# Patient Record
Sex: Female | Born: 1990
Health system: Southern US, Community
[De-identification: ages and names within clinical notes are randomized; demographics above are authoritative.]

## PROBLEM LIST (undated history)

## (undated) DIAGNOSIS — R87629 Unspecified abnormal cytological findings in specimens from vagina: Secondary | ICD-10-CM

## (undated) HISTORY — PX: NO PAST SURGERIES: SHX2092

## (undated) HISTORY — PX: GYNECOLOGIC CRYOSURGERY: SHX857

## (undated) HISTORY — DX: Unspecified abnormal cytological findings in specimens from vagina: R87.629

---

## 2009-03-01 ENCOUNTER — Ambulatory Visit: Payer: Self-pay | Admitting: Obstetrics and Gynecology

## 2009-03-01 ENCOUNTER — Encounter: Payer: Self-pay | Admitting: Family Medicine

## 2009-03-01 LAB — CONVERTED CEMR LAB
Antibody Screen: NEGATIVE
Basophils Absolute: 0 10*3/uL (ref 0.0–0.1)
Basophils Relative: 0 % (ref 0–1)
Eosinophils Absolute: 0.1 10*3/uL (ref 0.0–0.7)
Eosinophils Relative: 1 % (ref 0–5)
Hemoglobin: 12.4 g/dL (ref 12.0–15.0)
MCHC: 32.6 g/dL (ref 30.0–36.0)
MCV: 90.3 fL (ref 78.0–100.0)
Monocytes Absolute: 0.8 10*3/uL (ref 0.1–1.0)
Monocytes Relative: 9 % (ref 3–12)
RBC: 4.21 M/uL (ref 3.87–5.11)
RDW: 13.8 % (ref 11.5–15.5)
Rh Type: POSITIVE

## 2009-03-06 ENCOUNTER — Ambulatory Visit: Payer: Self-pay | Admitting: Obstetrics & Gynecology

## 2009-04-03 ENCOUNTER — Ambulatory Visit: Payer: Self-pay | Admitting: Obstetrics and Gynecology

## 2009-04-03 ENCOUNTER — Encounter: Payer: Self-pay | Admitting: Obstetrics & Gynecology

## 2009-04-03 LAB — CONVERTED CEMR LAB: Chlamydia, Swab/Urine, PCR: NEGATIVE

## 2009-04-24 ENCOUNTER — Ambulatory Visit: Payer: Self-pay | Admitting: Obstetrics and Gynecology

## 2009-04-24 ENCOUNTER — Encounter: Payer: Self-pay | Admitting: Obstetrics & Gynecology

## 2009-04-24 LAB — CONVERTED CEMR LAB
Hemoglobin: 11.4 g/dL — ABNORMAL LOW (ref 12.0–15.0)
MCHC: 33.5 g/dL (ref 30.0–36.0)
Platelets: 237 10*3/uL (ref 150–400)
RDW: 13.2 % (ref 11.5–15.5)

## 2009-05-10 ENCOUNTER — Ambulatory Visit: Payer: Self-pay | Admitting: Obstetrics & Gynecology

## 2009-05-24 ENCOUNTER — Ambulatory Visit: Payer: Self-pay | Admitting: Obstetrics & Gynecology

## 2009-06-12 ENCOUNTER — Ambulatory Visit: Payer: Self-pay | Admitting: Obstetrics & Gynecology

## 2009-06-26 ENCOUNTER — Ambulatory Visit: Payer: Self-pay | Admitting: Obstetrics & Gynecology

## 2009-06-27 ENCOUNTER — Encounter: Payer: Self-pay | Admitting: Obstetrics & Gynecology

## 2009-07-03 ENCOUNTER — Ambulatory Visit: Payer: Self-pay | Admitting: Obstetrics & Gynecology

## 2009-07-08 ENCOUNTER — Inpatient Hospital Stay (HOSPITAL_COMMUNITY): Admission: AD | Admit: 2009-07-08 | Discharge: 2009-07-08 | Payer: Self-pay | Admitting: Obstetrics and Gynecology

## 2009-07-10 ENCOUNTER — Ambulatory Visit: Payer: Self-pay | Admitting: Obstetrics and Gynecology

## 2009-07-17 ENCOUNTER — Ambulatory Visit: Payer: Self-pay | Admitting: Family Medicine

## 2009-07-19 ENCOUNTER — Ambulatory Visit: Payer: Self-pay | Admitting: Advanced Practice Midwife

## 2009-07-19 ENCOUNTER — Inpatient Hospital Stay (HOSPITAL_COMMUNITY): Admission: AD | Admit: 2009-07-19 | Discharge: 2009-07-21 | Payer: Self-pay | Admitting: Obstetrics & Gynecology

## 2009-09-04 ENCOUNTER — Ambulatory Visit: Payer: Self-pay | Admitting: Obstetrics & Gynecology

## 2010-05-13 LAB — GC/CHLAMYDIA PROBE AMP, GENITAL
Chlamydia, DNA Probe: NEGATIVE
GC Probe Amp, Genital: NEGATIVE

## 2010-05-13 LAB — WET PREP, GENITAL
Clue Cells Wet Prep HPF POC: NONE SEEN
Trich, Wet Prep: NONE SEEN
Yeast Wet Prep HPF POC: NONE SEEN

## 2010-05-13 LAB — URINALYSIS, ROUTINE W REFLEX MICROSCOPIC
Bilirubin Urine: NEGATIVE
Ketones, ur: NEGATIVE mg/dL
Nitrite: NEGATIVE
Specific Gravity, Urine: <1.005 — ABNORMAL LOW (ref 1.005–1.030)
Urobilinogen, UA: 0.2 mg/dL (ref 0.0–1.0)

## 2010-05-13 LAB — URINE MICROSCOPIC-ADD ON: RBC / HPF: NONE SEEN RBC/hpf (ref <3)

## 2010-05-13 LAB — CBC
Hemoglobin: 12.3 g/dL (ref 12.0–15.0)
MCHC: 34.3 g/dL (ref 30.0–36.0)
MCV: 85.1 fL (ref 78.0–100.0)
RBC: 4.22 MIL/uL (ref 3.87–5.11)
RDW: 13 % (ref 11.5–15.5)

## 2010-05-13 LAB — RPR: RPR Ser Ql: NONREACTIVE

## 2010-07-09 NOTE — Assessment & Plan Note (Signed)
NAMEBlima Ayala                ACCOUNT NO.:  [id number]   MEDICAL RECORD NO.:  [id number]          PATIENT TYPE:  POB   LOCATION:  [facility] at [facility]         FACILITY:  [facility]   PHYSICIAN:  Scheryl Darter, MD       DATE OF BIRTH:  April 29, 1990   DATE OF SERVICE:                                  CLINIC NOTE   The patient is postpartum from a vaginal delivery on Jul 19, 2009.  She  was at 40 weeks 1 day gestation and she delivered a 7 pound 7 ounce  female without significant lacerations.  She has done well postpartum.  She is nursing the baby and she wished to continue for a year.  She  would like to be on oral contraceptive.  She has not had sex since  delivery.  Her score for postpartum depression is low.  Her affect is  normal.   PHYSICAL EXAMINATION:  VITAL SIGNS:  Blood pressure is 116/72 and weight  is 116 pounds.  ABDOMEN:  Soft, nontender, no mass.  EXTERNAL GENITALIA:  Vagina and cervix appeared normal.  Uterus normal  size.  No adnexal masses or tenderness.   The patient is doing well.  She can return for yearly exams.  I gave her  a prescription for Micronor one p.o. daily.      Scheryl Darter, MD     JA/MEDQ  D:  09/04/2009  T:  09/05/2009  Job:  [id number]

## 2011-11-19 ENCOUNTER — Ambulatory Visit (INDEPENDENT_AMBULATORY_CARE_PROVIDER_SITE_OTHER): Payer: BC Managed Care – PPO | Admitting: Family Medicine

## 2011-11-19 DIAGNOSIS — N390 Urinary tract infection, site not specified: Secondary | ICD-10-CM

## 2011-11-19 LAB — POCT URINALYSIS DIPSTICK
Bilirubin, UA: NEGATIVE
Ketones, UA: NEGATIVE
Protein, UA: NEGATIVE
pH, UA: 6

## 2011-11-19 MED ORDER — NITROFURANTOIN MONOHYD MACRO 100 MG PO CAPS: 100.0000 mg | ORAL_CAPSULE | Freq: Two times a day (BID) | ORAL | Status: DC

## 2011-11-19 NOTE — Progress Notes (Signed)
Patient is having increased frequency with urination and would like to be checked, her urine is 1+ leukocytes and trace blood.

## 2012-01-06 ENCOUNTER — Encounter: Payer: Self-pay | Admitting: Obstetrics & Gynecology

## 2012-01-06 ENCOUNTER — Ambulatory Visit (INDEPENDENT_AMBULATORY_CARE_PROVIDER_SITE_OTHER): Payer: BC Managed Care – PPO | Admitting: Obstetrics & Gynecology

## 2012-01-06 VITALS — BP 106/68 | HR 67 | Ht 64.0 in | Wt 113.0 lb

## 2012-01-06 DIAGNOSIS — Z113 Encounter for screening for infections with a predominantly sexual mode of transmission: Secondary | ICD-10-CM

## 2012-01-06 DIAGNOSIS — Z1151 Encounter for screening for human papillomavirus (HPV): Secondary | ICD-10-CM

## 2012-01-06 DIAGNOSIS — Z01419 Encounter for gynecological examination (general) (routine) without abnormal findings: Secondary | ICD-10-CM

## 2012-01-06 DIAGNOSIS — Z0000 Encounter for general adult medical examination without abnormal findings: Secondary | ICD-10-CM

## 2012-01-06 DIAGNOSIS — Z124 Encounter for screening for malignant neoplasm of cervix: Secondary | ICD-10-CM

## 2012-01-06 NOTE — Progress Notes (Signed)
Subjective:    Kathleen Ayala is a 21 y.o. female who presents for an annual exam. The patient has no complaints today. The patient is sexually active. GYN screening history: last pap: was normal. The patient wears seatbelts: yes. The patient participates in regular exercise: yes. Has the patient ever been transfused or tattooed?: no. The patient reports that there is not domestic violence in her life.   Menstrual History: OB History    Grav Para Term Preterm Abortions TAB SAB Ect Mult Living   1 1 1       1       Menarche age: 59 Patient's last menstrual period was 12/22/2011.    The following portions of the patient's history were reviewed and updated as appropriate: allergies, current medications, past family history, past medical history, past social history, past surgical history and problem list.  Review of Systems A comprehensive review of systems was negative. She has been monogamous for 2 years and denies dyspareunia. Her 28 yo boyfriend is having issues with ED.   Objective:    BP 106/68  Pulse 67  Ht 5\' 4"  (1.626 m)  Wt 113 lb (51.256 kg)  BMI 19.40 kg/m2  LMP 12/22/2011  General Appearance:    Alert, cooperative, no distress, appears stated age  Head:    Normocephalic, without obvious abnormality, atraumatic  Eyes:    PERRL, conjunctiva/corneas clear, EOM's intact, fundi    benign, both eyes  Ears:    Normal TM's and external ear canals, both ears  Nose:   Nares normal, septum midline, mucosa normal, no drainage    or sinus tenderness  Throat:   Lips, mucosa, and tongue normal; teeth and gums normal  Neck:   Supple, symmetrical, trachea midline, no adenopathy;    thyroid:  no enlargement/tenderness/nodules; no carotid   bruit or JVD  Back:     Symmetric, no curvature, ROM normal, no CVA tenderness  Lungs:     Clear to auscultation bilaterally, respirations unlabored  Chest Wall:    No tenderness or deformity   Heart:    Regular rate and rhythm, S1 and S2 normal, no  murmur, rub   or gallop  Breast Exam:    No tenderness, masses, or nipple abnormality  Abdomen:     Soft, non-tender, bowel sounds active all four quadrants,    no masses, no organomegaly  Genitalia:    Normal female without lesion, discharge or tenderness, NSSRV, NT, no adnexal masses or tenderness     Extremities:   Extremities normal, atraumatic, no cyanosis or edema  Pulses:   2+ and symmetric all extremities  Skin:   Skin color, texture, turgor normal, no rashes or lesions  Lymph nodes:   Cervical, supraclavicular, and axillary nodes normal  Neurologic:   CNII-XII intact, normal strength, sensation and reflexes    throughout  .    Assessment:    Healthy female exam.    Plan:     Thin prep Pap smear.  She declines a flu vaccine today.

## 2012-05-12 ENCOUNTER — Ambulatory Visit: Payer: BC Managed Care – PPO | Admitting: Obstetrics and Gynecology

## 2012-05-12 DIAGNOSIS — Z3009 Encounter for other general counseling and advice on contraception: Secondary | ICD-10-CM

## 2012-09-28 ENCOUNTER — Encounter: Payer: Self-pay | Admitting: Family Medicine

## 2012-09-28 ENCOUNTER — Ambulatory Visit (INDEPENDENT_AMBULATORY_CARE_PROVIDER_SITE_OTHER): Payer: BC Managed Care – PPO | Admitting: Family Medicine

## 2012-09-28 VITALS — BP 120/73 | HR 85 | Ht 64.5 in | Wt 117.6 lb

## 2012-09-28 DIAGNOSIS — Z01812 Encounter for preprocedural laboratory examination: Secondary | ICD-10-CM

## 2012-09-28 DIAGNOSIS — Z113 Encounter for screening for infections with a predominantly sexual mode of transmission: Secondary | ICD-10-CM | POA: Insufficient documentation

## 2012-09-28 DIAGNOSIS — Z30430 Encounter for insertion of intrauterine contraceptive device: Secondary | ICD-10-CM

## 2012-09-28 MED ORDER — LEVONORGESTREL 20 MCG/24HR IU IUD: 1.0000 | INTRAUTERINE_SYSTEM | Freq: Once | INTRAUTERINE | Status: DC

## 2012-09-28 NOTE — Patient Instructions (Addendum)
Levonorgestrel intrauterine device (IUD) What is this medicine? LEVONORGESTREL IUD (LEE voe nor jes trel) is a contraceptive (birth control) device. The device is placed inside the uterus by a healthcare professional. It is used to prevent pregnancy and can also be used to treat heavy bleeding that occurs during your period. Depending on the device, it can be used for 3 to 5 years. This medicine may be used for other purposes; ask your health care provider or pharmacist if you have questions. What should I tell my health care provider before I take this medicine? They need to know if you have any of these conditions: -abnormal Pap smear -cancer of the breast, uterus, or cervix -diabetes -endometritis -genital or pelvic infection now or in the past -have more than one sexual partner or your partner has more than one partner -heart disease -history of an ectopic or tubal pregnancy -immune system problems -IUD in place -liver disease or tumor -problems with blood clots or take blood-thinners -use intravenous drugs -uterus of unusual shape -vaginal bleeding that has not been explained -an unusual or allergic reaction to levonorgestrel, other hormones, silicone, or polyethylene, medicines, foods, dyes, or preservatives -pregnant or trying to get pregnant -breast-feeding How should I use this medicine? This device is placed inside the uterus by a health care professional. Talk to your pediatrician regarding the use of this medicine in children. Special care may be needed. Overdosage: If you think you have taken too much of this medicine contact a poison control center or emergency room at once. NOTE: This medicine is only for you. Do not share this medicine with others. What if I miss a dose? This does not apply. What may interact with this medicine? Do not take this medicine with any of the following medications: -amprenavir -bosentan -fosamprenavir This medicine may also interact with  the following medications: -aprepitant -barbiturate medicines for inducing sleep or treating seizures -bexarotene -griseofulvin -medicines to treat seizures like carbamazepine, ethotoin, felbamate, oxcarbazepine, phenytoin, topiramate -modafinil -pioglitazone -rifabutin -rifampin -rifapentine -some medicines to treat HIV infection like atazanavir, indinavir, lopinavir, nelfinavir, tipranavir, ritonavir -St. John's wort -warfarin This list may not describe all possible interactions. Give your health care provider a list of all the medicines, herbs, non-prescription drugs, or dietary supplements you use. Also tell them if you smoke, drink alcohol, or use illegal drugs. Some items may interact with your medicine. What should I watch for while using this medicine? Visit your doctor or health care professional for regular check ups. See your doctor if you or your partner has sexual contact with others, becomes HIV positive, or gets a sexual transmitted disease. This product does not protect you against HIV infection (AIDS) or other sexually transmitted diseases. You can check the placement of the IUD yourself by reaching up to the top of your vagina with clean fingers to feel the threads. Do not pull on the threads. It is a good habit to check placement after each menstrual period. Call your doctor right away if you feel more of the IUD than just the threads or if you cannot feel the threads at all. The IUD may come out by itself. You may become pregnant if the device comes out. If you notice that the IUD has come out use a backup birth control method like condoms and call your health care provider. Using tampons will not change the position of the IUD and are okay to use during your period. What side effects may I notice from receiving this medicine?   Side effects that you should report to your doctor or health care professional as soon as possible: -allergic reactions like skin rash, itching or  hives, swelling of the face, lips, or tongue -fever, flu-like symptoms -genital sores -high blood pressure -no menstrual period for 6 weeks during use -pain, swelling, warmth in the leg -pelvic pain or tenderness -severe or sudden headache -signs of pregnancy -stomach cramping -sudden shortness of breath -trouble with balance, talking, or walking -unusual vaginal bleeding, discharge -yellowing of the eyes or skin Side effects that usually do not require medical attention (report to your doctor or health care professional if they continue or are bothersome): -acne -breast pain -change in sex drive or performance -changes in weight -cramping, dizziness, or faintness while the device is being inserted -headache -irregular menstrual bleeding within first 3 to 6 months of use -nausea This list may not describe all possible side effects. Call your doctor for medical advice about side effects. You may report side effects to FDA at 1-800-FDA-1088. Where should I keep my medicine? This does not apply. NOTE: This sheet is a summary. It may not cover all possible information. If you have questions about this medicine, talk to your doctor, pharmacist, or health care provider.  2013, Elsevier/Gold Standard. (03/13/2011 1:54:04 PM)  

## 2012-09-28 NOTE — Progress Notes (Signed)
  Subjective:    Patient ID: Kathleen Ayala, female    DOB: 03/23/1990, 23 y.o.   MRN: [id number]  HPI Here for Nexplanon insertion.  We have discussed Nexplanon side effects at length.  After careful consideration, pt. Elects for IUD.  New sexual partner since last GC/Chlam testing.   Review of Systems  Constitutional: Negative for fever and fatigue.  Cardiovascular: Negative for chest pain and leg swelling.  Gastrointestinal: Negative for abdominal pain.  Genitourinary: Negative for dysuria.       Objective:   Physical Exam  Vitals reviewed. Constitutional: She appears well-developed and well-nourished.  HENT:  Head: Normocephalic and atraumatic.  Eyes: No scleral icterus.  Cardiovascular: Normal rate.   Pulmonary/Chest: Effort normal.  Abdominal: Soft. There is no tenderness.  Genitourinary: Vagina normal and uterus normal.   Procedure: Patient identified, informed consent performed, signed copy in chart, time out was performed.  Urine pregnancy test negative.  Speculum placed in the vagina.  Cervix visualized.  Cleaned with Betadine x 2.  Grasped anteriourly with a single tooth tenaculum.  Uterus sounded to 8 cm.  Mirena IUD placed per manufacturer's recommendations.  Strings trimmed to 3 cm.   Patient given post procedure instructions and Mirena care card with expiration date.  Patient is asked to check IUD strings periodically and follow up in 4-6 weeks for IUD check.        Assessment & Plan:

## 2012-09-29 LAB — GC/CHLAMYDIA PROBE AMP: GC Probe RNA: NEGATIVE

## 2013-02-21 ENCOUNTER — Other Ambulatory Visit (INDEPENDENT_AMBULATORY_CARE_PROVIDER_SITE_OTHER): Payer: BC Managed Care – PPO | Admitting: *Deleted

## 2013-02-21 DIAGNOSIS — R300 Dysuria: Secondary | ICD-10-CM

## 2013-02-21 LAB — POCT URINALYSIS DIPSTICK
Ketones, UA: NEGATIVE
Protein, UA: NEGATIVE
Spec Grav, UA: >=1.03
Urobilinogen, UA: NEGATIVE
pH, UA: 6

## 2013-02-21 NOTE — Progress Notes (Signed)
Pt came in today to drop off a urine specimen.  Pt is having burning with urination and back pain.  I will send off a urine culture.  Pt is on her cycle currently.

## 2013-02-23 LAB — GC/CHLAMYDIA PROBE AMP, URINE: Chlamydia, Swab/Urine, PCR: NEGATIVE

## 2013-02-24 LAB — URINE CULTURE: Colony Count: 40000

## 2013-05-10 ENCOUNTER — Encounter: Payer: Self-pay | Admitting: Obstetrics & Gynecology

## 2013-05-10 ENCOUNTER — Ambulatory Visit (INDEPENDENT_AMBULATORY_CARE_PROVIDER_SITE_OTHER): Payer: BC Managed Care – PPO | Admitting: Obstetrics & Gynecology

## 2013-05-10 VITALS — BP 119/77 | HR 78 | Ht 64.0 in | Wt 115.0 lb

## 2013-05-10 DIAGNOSIS — Z30431 Encounter for routine checking of intrauterine contraceptive device: Secondary | ICD-10-CM

## 2013-05-10 DIAGNOSIS — N898 Other specified noninflammatory disorders of vagina: Secondary | ICD-10-CM

## 2013-05-10 NOTE — Progress Notes (Signed)
Subjective:     Patient ID: Kathleen Ayala, female   DOB: 04/08/1990, 23 y.o.   MRN: [id number]  HPI Pt c/o thick white cottage cheese discharge last Friday.  Used monistat OTC.  Sx imporved but, pt felt her IUD strings and is worried. No pain or abnormal sx   Review of Systems     Objective:   Physical Exam BP 119/77  Pulse 78  Ht 5\' 4"  (1.626 m)  Wt 115 lb (52.164 kg)  BMI 19.73 kg/m2  Pt in NAD GU: EGBUS: no lesions Vagina: slightly blood tinged fluid; no white clumps Cervix: no lesion; no mucopurulent d/c; no CMT. Strings noted and trimmed         Assessment:     Yeast vaginitis  IUD check    Plan:     KOH and wet smear done  IUD strings trimmed

## 2013-05-10 NOTE — Patient Instructions (Signed)
Monilial Vaginitis  Vaginitis in a soreness, swelling and redness (inflammation) of the vagina and vulva. Monilial vaginitis is not a sexually transmitted infection.  CAUSES   Yeast vaginitis is caused by yeast (candida) that is normally found in your vagina. With a yeast infection, the candida has overgrown in number to a point that upsets the chemical balance.  SYMPTOMS   · White, thick vaginal discharge.  · Swelling, itching, redness and irritation of the vagina and possibly the lips of the vagina (vulva).  · Burning or painful urination.  · Painful intercourse.  DIAGNOSIS   Things that may contribute to monilial vaginitis are:  · Postmenopausal and virginal states.  · Pregnancy.  · Infections.  · Being tired, sick or stressed, especially if you had monilial vaginitis in the past.  · Diabetes. Good control will help lower the chance.  · Birth control pills.  · Tight fitting garments.  · Using bubble bath, feminine sprays, douches or deodorant tampons.  · Taking certain medications that kill germs (antibiotics).  · Sporadic recurrence can occur if you become ill.  TREATMENT   Your caregiver will give you medication.  · There are several kinds of anti monilial vaginal creams and suppositories specific for monilial vaginitis. For recurrent yeast infections, use a suppository or cream in the vagina 2 times a week, or as directed.  · Anti-monilial or steroid cream for the itching or irritation of the vulva may also be used. Get your caregiver's permission.  · Painting the vagina with methylene blue solution may help if the monilial cream does not work.  · Eating yogurt may help prevent monilial vaginitis.  HOME CARE INSTRUCTIONS   · Finish all medication as prescribed.  · Do not have sex until treatment is completed or after your caregiver tells you it is okay.  · Take warm sitz baths.  · Do not douche.  · Do not use tampons, especially scented ones.  · Wear cotton underwear.  · Avoid tight pants and panty  hose.  · Tell your sexual partner that you have a yeast infection. They should go to their caregiver if they have symptoms such as mild rash or itching.  · Your sexual partner should be treated as well if your infection is difficult to eliminate.  · Practice safer sex. Use condoms.  · Some vaginal medications cause latex condoms to fail. Vaginal medications that harm condoms are:  · Cleocin cream.  · Butoconazole (Femstat®).  · Terconazole (Terazol®) vaginal suppository.  · Miconazole (Monistat®) (may be purchased over the counter).  SEEK MEDICAL CARE IF:   · You have a temperature by mouth above 102° F (38.9° C).  · The infection is getting worse after 2 days of treatment.  · The infection is not getting better after 3 days of treatment.  · You develop blisters in or around your vagina.  · You develop vaginal bleeding, and it is not your menstrual period.  · You have pain when you urinate.  · You develop intestinal problems.  · You have pain with sexual intercourse.  Document Released: 11/20/2004 Document Revised: 05/05/2011 Document Reviewed: 08/04/2008  ExitCare® Patient Information ©2014 [employer].

## 2013-05-11 LAB — WET PREP, GENITAL
TRICH WET PREP: NONE SEEN
WBC, Wet Prep HPF POC: NONE SEEN
YEAST WET PREP: NONE SEEN

## 2013-12-26 ENCOUNTER — Encounter: Payer: Self-pay | Admitting: Obstetrics & Gynecology

## 2015-05-01 ENCOUNTER — Encounter: Payer: Self-pay | Admitting: Obstetrics & Gynecology

## 2015-05-01 ENCOUNTER — Ambulatory Visit (INDEPENDENT_AMBULATORY_CARE_PROVIDER_SITE_OTHER): Payer: Managed Care, Other (non HMO) | Admitting: Obstetrics & Gynecology

## 2015-05-01 VITALS — BP 111/74 | HR 71 | Ht 64.0 in | Wt 114.0 lb

## 2015-05-01 DIAGNOSIS — Z01419 Encounter for gynecological examination (general) (routine) without abnormal findings: Secondary | ICD-10-CM | POA: Diagnosis not present

## 2015-05-01 DIAGNOSIS — Z1151 Encounter for screening for human papillomavirus (HPV): Secondary | ICD-10-CM | POA: Diagnosis not present

## 2015-05-01 DIAGNOSIS — L299 Pruritus, unspecified: Secondary | ICD-10-CM | POA: Diagnosis not present

## 2015-05-01 DIAGNOSIS — Z124 Encounter for screening for malignant neoplasm of cervix: Secondary | ICD-10-CM

## 2015-05-01 DIAGNOSIS — Z30431 Encounter for routine checking of intrauterine contraceptive device: Secondary | ICD-10-CM | POA: Diagnosis not present

## 2015-05-01 DIAGNOSIS — Z113 Encounter for screening for infections with a predominantly sexual mode of transmission: Secondary | ICD-10-CM | POA: Diagnosis not present

## 2015-05-01 NOTE — Progress Notes (Signed)
Patient ID: Kathleen PorteousJessica M Mann, female   DOB: 09/18/1990, 25 y.o.   MRN: [id number] Subjective:     Kathleen PorteousJessica M Mann is a 25 y.o. female here for a routine exam.  Current complaints: thick white discharge.  Slight itching.  Pt desires STI screening.  Monogamous for 3 years now new partner.   Gynecologic History No LMP recorded. Patient is not currently having periods (Reason: IUD). Contraception: IUD Last Pap: 01/06/2012. Results were: normal Last mammogram: n/a.   Obstetric History OB History  Gravida Para Term Preterm AB SAB TAB Ectopic Multiple Living  1 1 1       1     # Outcome Date GA Lbr Len/2nd Weight Sex Delivery Anes PTL Lv  1 Term 07/19/09    F    Y     History reviewed. No pertinent past medical history. History reviewed. No pertinent past surgical history. Current Outpatient Prescriptions on File Prior to Visit  Medication Sig Dispense Refill  . levonorgestrel (MIRENA) 20 MCG/24HR IUD 1 Intra Uterine Device (1 each total) by Intrauterine route once. Will place in clinic 1 each 0   No current facility-administered medications on file prior to visit.  No Known Allergies    The following portions of the patient's history were reviewed and updated as appropriate: allergies, current medications, past family history, past medical history, past social history, past surgical history and problem list.  Review of Systems Pertinent items are noted in HPI.    Objective:  BP 111/74 mmHg  Pulse 71  Ht 5\' 4"  (1.626 m)  Wt 114 lb (51.71 kg)  BMI 19.56 kg/m2  General Appearance:    Alert, cooperative, no distress, appears stated age  Head:    Normocephalic, without obvious abnormality, atraumatic  Eyes:    conjunctiva/corneas clear, EOM's intact, both eyes  Ears:    Normal external ear canals, both ears  Nose:   Nares normal, septum midline, mucosa normal, no drainage    or sinus tenderness  Throat:   Lips, mucosa, and tongue normal; teeth and gums normal  Neck:   Supple,  symmetrical, trachea midline, no adenopathy;    thyroid:  no enlargement/tenderness/nodules  Back:     Symmetric, no curvature, ROM normal, no CVA tenderness  Lungs:     Clear to auscultation bilaterally, respirations unlabored  Chest Wall:    No tenderness or deformity   Heart:    Regular rate and rhythm, S1 and S2 normal, no murmur, rub   or gallop  Breast Exam:    No tenderness, masses, or nipple abnormality  Abdomen:     Soft, non-tender, bowel sounds active all four quadrants,    no masses, no organomegaly  Genitalia:    Normal female without lesion, discharge or tenderness; uterus: small; mobile      Extremities:   Extremities normal, atraumatic, no cyanosis or edema  Pulses:   2+ and symmetric all extremities  Skin:   Skin color, texture, turgor normal, no rashes or lesions      Assessment:    Healthy female exam.   STI screen Vaginal itching- normal exam    Plan:    Follow up in: 1 year.    F/u PAP and cx; KOH and wet smear Labs: HIV, RPR, Hep C  Tyneshia Stivers L. Harraway-Smith, M.D., Evern CoreFACOG

## 2015-05-02 LAB — HEPATITIS C ANTIBODY: HCV AB: NEGATIVE

## 2015-05-02 LAB — HIV ANTIBODY (ROUTINE TESTING W REFLEX): HIV: NONREACTIVE

## 2015-05-02 LAB — RPR

## 2015-05-03 LAB — CYTOLOGY - PAP

## 2015-05-04 LAB — CERVICOVAGINAL ANCILLARY ONLY
CANDIDA VAGINITIS: NEGATIVE
HERPES (WINDOWPATH): NEGATIVE

## 2015-05-05 ENCOUNTER — Other Ambulatory Visit: Payer: Self-pay | Admitting: Obstetrics & Gynecology

## 2015-05-05 DIAGNOSIS — N760 Acute vaginitis: Principal | ICD-10-CM

## 2015-05-05 DIAGNOSIS — B9689 Other specified bacterial agents as the cause of diseases classified elsewhere: Secondary | ICD-10-CM

## 2015-05-05 MED ORDER — METRONIDAZOLE 500 MG PO TABS: 500.0000 mg | ORAL_TABLET | Freq: Two times a day (BID) | ORAL | Status: DC

## 2015-05-07 ENCOUNTER — Telehealth: Payer: Self-pay | Admitting: *Deleted

## 2015-05-07 NOTE — Telephone Encounter (Signed)
Pt called requesting results, informed her of + BV on wet prep and that medication had been sent to pharmacy.  Also informed her of abnormal pap and need for colpo, answered pt questions.  Scheduled appt 05-24-15 for colpo.

## 2015-05-07 NOTE — Progress Notes (Signed)
Left message for patient to call us back regarding lab work.

## 2015-05-08 NOTE — Progress Notes (Signed)
Called patient and informed her of results & medication sent to pharmacy. Patient verbalized understanding and asked for a sooner appt. Provided patient the appt desk number and instructed her to call them for a sooner appt. Patient verbalized understanding & had no questions

## 2015-05-24 ENCOUNTER — Encounter: Payer: Self-pay | Admitting: Family Medicine

## 2015-05-24 ENCOUNTER — Ambulatory Visit (INDEPENDENT_AMBULATORY_CARE_PROVIDER_SITE_OTHER): Payer: Managed Care, Other (non HMO) | Admitting: Family Medicine

## 2015-05-24 VITALS — BP 123/85 | HR 68 | Resp 16 | Ht 64.0 in | Wt 112.0 lb

## 2015-05-24 DIAGNOSIS — R87810 Cervical high risk human papillomavirus (HPV) DNA test positive: Secondary | ICD-10-CM | POA: Diagnosis not present

## 2015-05-24 DIAGNOSIS — R87610 Atypical squamous cells of undetermined significance on cytologic smear of cervix (ASC-US): Secondary | ICD-10-CM

## 2015-05-24 DIAGNOSIS — Z01812 Encounter for preprocedural laboratory examination: Secondary | ICD-10-CM

## 2015-05-24 DIAGNOSIS — N871 Moderate cervical dysplasia: Secondary | ICD-10-CM | POA: Insufficient documentation

## 2015-05-24 LAB — POCT URINE PREGNANCY: PREG TEST UR: NEGATIVE

## 2015-05-24 NOTE — Patient Instructions (Signed)
Colposcopy  Colposcopy is a procedure to examine your cervix and vagina, or the area around the outside of your vagina, for abnormalities or signs of disease. The procedure is done using a lighted microscope called a colposcope. Tissue samples may be collected during the colposcopy if your health care provider finds any unusual cells. A colposcopy may be done if a woman has:  · An abnormal Pap test. A Pap test is a medical test done to evaluate cells that are on the surface of the cervix.  · A Pap test result that is suggestive of human papillomavirus (HPV). This virus can cause genital warts and is linked to the development of cervical cancer.  · A sore on her cervix and the results of a Pap test were normal.  · Genital warts on the cervix or in or around the outside of the vagina.  · A mother who took the drug diethylstilbestrol (DES) while pregnant.  · Painful intercourse.  · Vaginal bleeding, especially after sexual intercourse.  LET YOUR HEALTH CARE PROVIDER KNOW ABOUT:  · Any allergies you have.  · All medicines you are taking, including vitamins, herbs, eye drops, creams, and over-the-counter medicines.  · Previous problems you or members of your family have had with the use of anesthetics.  · Any blood disorders you have.  · Previous surgeries you have had.  · Medical conditions you have.  RISKS AND COMPLICATIONS  Generally, a colposcopy is a safe procedure. However, as with any procedure, complications can occur. Possible complications include:  · Bleeding.  · Infection.  · Missed lesions.  BEFORE THE PROCEDURE   · Tell your health care provider if you have your menstrual period. A colposcopy typically is not done during menstruation.  · For 24 hours before the colposcopy, do not:    Douche.    Use tampons.    Use medicines, creams, or suppositories in the vagina.    Have sexual intercourse.  PROCEDURE   During the procedure, you will be lying on your back with your feet in foot rests (stirrups). A warm  metal or plastic instrument (speculum) will be placed in your vagina to keep it open and to allow the health care provider to see the cervix. The colposcope will be placed outside the vagina. It will be used to magnify and examine the cervix, vagina, and the area around the outside of the vagina. A small amount of liquid solution will be placed on the area that is to be viewed. This solution will make it easier to see the abnormal cells. Your health care provider will use tools to suck out mucus and cells from the canal of the cervix. Then he or she will record the location of the abnormal areas.  If a biopsy is done during the procedure, a medicine will usually be given to numb the area (local anesthetic). You may feel mild pain or cramping while the biopsy is done. After the procedure, tissue samples collected during the biopsy will be sent to a lab for analysis.  AFTER THE PROCEDURE   You will be given instructions on when to follow up with your health care provider for your test results. It is important to keep your appointment.     This information is not intended to replace advice given to you by your health care provider. Make sure you discuss any questions you have with your health care provider.     Document Released: 05/03/2002 Document Revised: 10/13/2012 Document Reviewed: 09/09/2012    Elsevier Interactive Patient Education ©2016 Elsevier Inc.

## 2015-05-25 NOTE — Progress Notes (Signed)
    Subjective:    Patient ID: Kathleen Ayala is a 25 y.o. female presenting with Colposcopy  on 05/24/2015  HPI: Here for colpo. Pap is ASC-US with + HPV.  Review of Systems  Constitutional: Negative for fever and chills.  Genitourinary: Negative for vaginal bleeding, vaginal pain and pelvic pain.      Objective:    BP 123/85 mmHg  Pulse 68  Resp 16  Ht 5\' 4"  (1.626 m)  Wt 112 lb (50.803 kg)  BMI 19.22 kg/m2 Physical Exam  Constitutional: She appears well-developed and well-nourished.  HENT:  Head: Normocephalic and atraumatic.  Cardiovascular: Normal rate.   Pulmonary/Chest: Effort normal.  Abdominal: Soft.  Genitourinary: Vagina normal.   Procedure: Patient given informed consent, signed copy in the chart, time out was performed.  Placed in lithotomy position. Cervix viewed with speculum and colposcope after application of acetic acid.   Colposcopy adequate?  yes Acetowhite lesions?yes Punctation?yes Mosaicism?  no Abnormal vasculature?  no Biopsies?yes 12, 5, 8 o'clock ECC?yes  COMMENTS: Patient was given post procedure instructions. Results are available on myChart      Assessment & Plan:   Problem List Items Addressed This Visit      Unprioritized   Atypical squamous cell changes of undetermined significance (ASCUS) on cervical cytology with positive high risk human papilloma virus (HPV)    F/u based on colposcopy results.      Relevant Orders   Surgical pathology    Other Visit Diagnoses    Pre-procedure lab exam    -  Primary    Relevant Orders    POCT urine pregnancy (Completed)       Osbaldo Mark S 05/25/2015 10:31 AM

## 2015-05-25 NOTE — Assessment & Plan Note (Signed)
F/u based on colposcopy results.

## 2015-05-28 ENCOUNTER — Telehealth: Payer: Self-pay | Admitting: *Deleted

## 2015-05-28 NOTE — Telephone Encounter (Signed)
-----   Message from Reva Boresanya S Pratt, MD sent at 05/25/2015 11:55 AM EDT ----- Needs cryo - please schedule.

## 2015-05-28 NOTE — Telephone Encounter (Signed)
Informed pt of Colpo results and the recommendation for Cryo treatment, reviewed the treatment and side effects.  Pt scheduled appt on 06-21-15 at 1000.

## 2015-05-30 ENCOUNTER — Encounter: Payer: Self-pay | Admitting: Family Medicine

## 2015-06-18 ENCOUNTER — Telehealth: Payer: Self-pay | Admitting: *Deleted

## 2015-06-18 NOTE — Telephone Encounter (Signed)
Pt c/o lower left pelvic discomfort and increased pressure at her rectum and perineum.  Pt also has a history of irregular bowel movements   Informed pt that pain could possibly be related to ovarian cyst and possible cyst rupture or constipation and bowel problems could cause the discomfort as well. Informed pt to take Ibuprofen for discomfort.  Has an appt on Thursday 06-21-15, if pain has continued at that appt she could discuss it with the physician at that time.  Pt acknowledged instructions.

## 2015-06-21 ENCOUNTER — Ambulatory Visit (INDEPENDENT_AMBULATORY_CARE_PROVIDER_SITE_OTHER): Payer: Managed Care, Other (non HMO) | Admitting: Family Medicine

## 2015-06-21 ENCOUNTER — Encounter: Payer: Self-pay | Admitting: Family Medicine

## 2015-06-21 VITALS — BP 105/61 | HR 71 | Resp 16

## 2015-06-21 DIAGNOSIS — Z01812 Encounter for preprocedural laboratory examination: Secondary | ICD-10-CM

## 2015-06-21 DIAGNOSIS — N871 Moderate cervical dysplasia: Secondary | ICD-10-CM | POA: Diagnosis not present

## 2015-06-21 DIAGNOSIS — R87613 High grade squamous intraepithelial lesion on cytologic smear of cervix (HGSIL): Secondary | ICD-10-CM

## 2015-06-21 LAB — POCT URINE PREGNANCY: Preg Test, Ur: NEGATIVE

## 2015-06-21 NOTE — Progress Notes (Signed)
    Subjective:    Patient ID: Kathleen Ayala is a 25 y.o. 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  Review of Systems  Constitutional: Negative for fever and chills.  Respiratory: Negative for shortness of breath.   Cardiovascular: Negative for chest pain.  Gastrointestinal: Negative for abdominal pain.  Genitourinary: Negative for dysuria, urgency and pelvic pain.      Objective:    BP 105/61 mmHg  Pulse 71  Resp 16 Physical Exam  Constitutional: She appears well-developed and well-nourished. No distress.  Eyes: No scleral icterus.  Neck: Neck supple.  Cardiovascular: Normal rate.   Pulmonary/Chest: Effort normal.  Abdominal: Soft. There is no tenderness.     Cryotherapy details  The indications for cryotherapy were reviewed with the patient in detail. She was counseled about that efficacy of this procedure, and possible need for excisional procedure in the future if her cervical dysplasia persists.  The risks of the procedure where explained in detail and patient was told to expect a copious amount of discharge in the next few weeks. All her questions were answered, and written informed consent was obtained.  The patient was placed in the dorsal lithotomy position and a vaginal speculum was placed. Her cervix was visualized and patient was noted to have had normal size transformation zone. The appropriate cryotherapy probe was picked and affixed to cryotherapy apparatus. Then nitrogen gas was then activated, the probe was coated with lubricating jelly and applied to the transformation zone of the cervix. This was kept in place for 3 minutes. The cryotherapy was then stopped and all instruments were removed from the patient's pelvis; a thawing period of 3 minutes was observed.  A second cycle of cryotherapy was then administered to the cervix for 3 minutes.  The patient tolerated the procedure well without any  complications. Routine post procedure instructions were given to the patient.  Will repeat pap smear in 6 months and manage accordingly.     Assessment & Plan:   Problem List Items Addressed This Visit    None    Visit Diagnoses    HGSIL on Pap smear of cervix    -  Primary    Relevant Orders    POCT urine pregnancy (Completed)      S/p cryo   Return in about 6 months (around 12/21/2015) for repeat pap.  Raylin Winer S 06/21/2015 1:58 PM

## 2015-06-21 NOTE — Patient Instructions (Signed)
Cryoablation Cryoablation is used to remove abnormal or cancerous tissue by freezing. A probe cooled with liquid nitrogen is directed beneath the skin to the growth. The growth is frozen and destroyed. During cryoablation, liquid nitrogen or argon gas flows into a needlelike applicator (cryoprobe), creating intense cold that is placed in contact with the diseased tissue. Imaging techniques such as an ultrasound, CT scans, or an MRI may be used to help guide the cryoprobes to the proper location inside the body. LET YOUR HEALTH CARE PROVIDER KNOW ABOUT:   Any allergies you have.   All medicines you are taking, including vitamins, herbs, eye drops, creams, and over-the-counter medicines.  Previous problems you or members of your family have had with the use of anesthetics.  Any blood disorders you have.  Previous surgeries you have had.  Medical conditions you have.  RISKS AND COMPLICATIONS  Generally, this is a safe procedure. However, as with any procedure, complications can occur. Possible complications include:   Infection.  Bleeding.  Tumor is not completely destroyed or the tumor comes back (recurrence). BEFORE THE PROCEDURE   You may need to have blood tests. These tests can help tell how well your kidneys and liver are working. They can also show how well your blood clots.   If you take blood thinners, ask your health care provider when you should stop taking them.   Make arrangements for someone to drive you home. Depending on the procedure, you may be able to go home the same day. However, most people stay overnight in the hospital after this procedure. Ask your health care provider what to expect.  PROCEDURE  This procedure usually takes about 1 to 3 hours.  You will lie on an exam table. You will be connected to monitors that keep track of your heart rate, blood pressure, and breathing throughout the procedure.  An IV access tube will be placed in one of your  veins. You may be given any of the following:  A medicine that makes you sleep through the procedure (general anesthetic).  A medicine to relax you (sedative).  A medicine to numb the area (local anesthetic).   For diseased tissue located deep in the body, your health care provider will use image guidance to insert one or more cryoprobes through the skin to the site of the diseased tissue. The liquid nitrogen or argon gas will then be delivered.  To cause cell death of the diseased tissue, the tissue is repeatedly frozen and thawed. Typically, two freeze-thaw cycles are used. Once the cells are destroyed, the white blood cells of the immune system will work to clear out the dead tissue. AFTER THE PROCEDURE   If you were given a sedative, you will be sleepy.  If you were put under general anesthesia, your throat may be sore after you wake up. This is caused by the breathing tube that was placed in your throat while you were asleep.  You may be allowed to go home when you are awake and able to drink fluids, or you may need to stay in the hospital overnight.  The puncture site will be sore. This is normal. The soreness will go away in 3-5 days. Some minor bruising may also be present.  You may have some mild abdominal, flank, or right shoulder pain for 24 hours.   This information is not intended to replace advice given to you by your health care provider. Make sure you discuss any questions you have with your   health care provider.   Document Released: 12/01/2012 Document Reviewed: 12/01/2012 Elsevier Interactive Patient Education 2016 Elsevier Inc.  

## 2015-06-22 ENCOUNTER — Encounter: Payer: Self-pay | Admitting: *Deleted

## 2016-04-03 ENCOUNTER — Encounter: Payer: Self-pay | Admitting: Obstetrics & Gynecology

## 2016-04-03 ENCOUNTER — Ambulatory Visit (INDEPENDENT_AMBULATORY_CARE_PROVIDER_SITE_OTHER): Payer: Managed Care, Other (non HMO) | Admitting: Obstetrics & Gynecology

## 2016-04-03 VITALS — BP 116/78 | HR 69 | Resp 18 | Ht 64.0 in | Wt 114.0 lb

## 2016-04-03 DIAGNOSIS — R102 Pelvic and perineal pain: Secondary | ICD-10-CM

## 2016-04-03 DIAGNOSIS — Z23 Encounter for immunization: Secondary | ICD-10-CM

## 2016-04-03 DIAGNOSIS — Z01419 Encounter for gynecological examination (general) (routine) without abnormal findings: Secondary | ICD-10-CM | POA: Diagnosis not present

## 2016-04-03 DIAGNOSIS — N871 Moderate cervical dysplasia: Secondary | ICD-10-CM

## 2016-04-03 DIAGNOSIS — N898 Other specified noninflammatory disorders of vagina: Secondary | ICD-10-CM

## 2016-04-03 DIAGNOSIS — Z0000 Encounter for general adult medical examination without abnormal findings: Secondary | ICD-10-CM | POA: Diagnosis not present

## 2016-04-03 NOTE — Progress Notes (Signed)
   Subjective:    Patient ID: Kathleen PorteousJessica M Ayala, female    DOB: 08/27/1990, 26 y.o.   MRN: [id number]  HPI  26 yo SW G0 here for a follow up pap. She had cryo for CIN2 about 6 months ago. She complains of a swollen painful vulva, pain with sex due to this   Review of Systems She has Mirena for contraception. Monogamous for about 2 years.    Objective:   Physical Exam Pleasant [id number] Breathing, conversing, and ambulating normally Vuvla with some erythema, swelling  Moderate amount of white discharge, non-specific Mirena strings visualized       Assessment & Plan:  Preventative care- flu vaccine today H/o CIN 2- repeat pap today STI testing Vulvar pain and swelling Vaginal discharge- wet prep

## 2016-04-04 LAB — CERVICOVAGINAL ANCILLARY ONLY
BACTERIAL VAGINITIS: NEGATIVE
CANDIDA VAGINITIS: POSITIVE — AB
Chlamydia: NEGATIVE
Neisseria Gonorrhea: NEGATIVE
Trichomonas: NEGATIVE

## 2016-04-05 LAB — HSV 2 ANTIBODY, IGG

## 2016-04-05 LAB — HIV ANTIBODY (ROUTINE TESTING W REFLEX): HIV SCREEN 4TH GENERATION: NONREACTIVE

## 2016-04-05 LAB — HEPATITIS C ANTIBODY: Hep C Virus Ab: <0.1 s/co ratio (ref 0.0–0.9)

## 2016-04-05 LAB — RPR: RPR: NONREACTIVE

## 2016-04-05 LAB — HEPATITIS B SURFACE ANTIGEN: Hepatitis B Surface Ag: NEGATIVE

## 2016-04-05 LAB — TSH: TSH: 0.857 u[IU]/mL (ref 0.450–4.500)

## 2016-04-07 LAB — CYTOLOGY - PAP: DIAGNOSIS: NEGATIVE

## 2016-04-08 ENCOUNTER — Telehealth: Payer: Self-pay | Admitting: *Deleted

## 2016-04-08 NOTE — Telephone Encounter (Signed)
Called pt to go over lab results. Pt expressed understanding.

## 2016-04-08 NOTE — Telephone Encounter (Signed)
-----   Message from Lindell SparHeather L Bacon, [place] sent at 04/08/2016 11:13 AM EST ----- Regarding: pt calling for results Contact: [phone] Please call pt back from results from the pap & labs

## 2017-02-25 ENCOUNTER — Encounter: Payer: Self-pay | Admitting: Radiology

## 2017-03-27 ENCOUNTER — Ambulatory Visit (INDEPENDENT_AMBULATORY_CARE_PROVIDER_SITE_OTHER): Payer: Managed Care, Other (non HMO) | Admitting: Family Medicine

## 2017-03-27 ENCOUNTER — Encounter: Payer: Self-pay | Admitting: Family Medicine

## 2017-03-27 VITALS — BP 132/100 | HR 72 | Wt 121.2 lb

## 2017-03-27 DIAGNOSIS — Z30011 Encounter for initial prescription of contraceptive pills: Secondary | ICD-10-CM | POA: Diagnosis not present

## 2017-03-27 DIAGNOSIS — Z30432 Encounter for removal of intrauterine contraceptive device: Secondary | ICD-10-CM

## 2017-03-27 MED ORDER — NORETHINDRONE-ETH ESTRADIOL 0.4-35 MG-MCG PO TABS: 1.0000 | ORAL_TABLET | Freq: Every day | ORAL | 6 refills | Status: DC

## 2017-03-27 NOTE — Progress Notes (Signed)
   Subjective:    Patient ID: Kathleen Ayala is a 27 y.o. female presenting with iud consult  on 03/27/2017  HPI: Here for IUD removal. Getting married. Has had it for 5 years and needs it replaced. She is too scared of getting another one placed due to pain. After lengthy discussion she opts for OC's until ready to conceive.  Review of Systems  Constitutional: Negative for chills and fever.  Respiratory: Negative for shortness of breath.   Cardiovascular: Negative for chest pain.  Gastrointestinal: Negative for abdominal pain, nausea and vomiting.  Genitourinary: Negative for dysuria.  Skin: Negative for rash.      Objective:    BP (!) 132/100   Pulse 72   Wt 121 lb 3.2 oz (55 kg)   BMI 20.80 kg/m  Physical Exam  Constitutional: She is oriented to person, place, and time. She appears well-developed and well-nourished. No distress.  HENT:  Head: Normocephalic and atraumatic.  Eyes: No scleral icterus.  Neck: Neck supple.  Cardiovascular: Normal rate.  Pulmonary/Chest: Effort normal.  Abdominal: Soft.  Neurological: She is alert and oriented to person, place, and time.  Skin: Skin is warm and dry.  Psychiatric: She has a normal mood and affect.   Procedure: Speculum placed inside vagina.  Cervix visualized.  Strings grasped with ring forceps.  IUD removed intact.     Assessment & Plan:  Encounter for initial prescription of contraceptive pills - discussed taking this and skipping placebos--rx sent in. common side effects discussed. - Plan: norethindrone-ethinyl estradiol (OVCON-35, 28,) 0.4-35 MG-MCG tablet  Encounter for IUD removal   Total face-to-face time with patient: 15 minutes. Over 50% of encounter was spent on counseling and coordination of care.  Reva Boresanya S Pratt 03/27/2017 3:54 PM

## 2017-03-27 NOTE — Patient Instructions (Signed)
Oral Contraception Information Oral contraceptive pills (OCPs) are medicines taken to prevent pregnancy. OCPs work by preventing the ovaries from releasing eggs. The hormones in OCPs also cause the cervical mucus to thicken, preventing the sperm from entering the uterus. The hormones also cause the uterine lining to become thin, not allowing a fertilized egg to attach to the inside of the uterus. OCPs are highly effective when taken exactly as prescribed. However, OCPs do not prevent sexually transmitted diseases (STDs). Safe sex practices, such as using condoms along with the pill, can help prevent STDs. Before taking the pill, you may have a physical exam and Pap test. Your health care provider may order blood tests. The health care provider will make sure you are a good candidate for oral contraception. Discuss with your health care provider the possible side effects of the OCP you may be prescribed. When starting an OCP, it can take 2 to 3 months for the body to adjust to the changes in hormone levels in your body. Types of oral contraception  The combination pill-This pill contains estrogen and progestin (synthetic progesterone) hormones. The combination pill comes in 21-day, 28-day, or 91-day packs. Some types of combination pills are meant to be taken continuously (365-day pills). With 21-day packs, you do not take pills for 7 days after the last pill. With 28-day packs, the pill is taken every day. The last 7 pills are without hormones. Certain types of pills have more than 21 hormone-containing pills. With 91-day packs, the first 84 pills contain both hormones, and the last 7 pills contain no hormones or contain estrogen only.  The minipill-This pill contains the progesterone hormone only. The pill is taken every day continuously. It is very important to take the pill at the same time each day. The minipill comes in packs of 28 pills. All 28 pills contain the hormone. Advantages of oral  contraceptive pills  Decreases premenstrual symptoms.  Treats menstrual period cramps.  Regulates the menstrual cycle.  Decreases a heavy menstrual flow.  May treatacne, depending on the type of pill.  Treats abnormal uterine bleeding.  Treats polycystic ovarian syndrome.  Treats endometriosis.  Can be used as emergency contraception. Things that can make oral contraceptive pills less effective OCPs can be less effective if:  You forget to take the pill at the same time every day.  You have a stomach or intestinal disease that lessens the absorption of the pill.  You take OCPs with other medicines that make OCPs less effective, such as antibiotics, certain HIV medicines, and some seizure medicines.  You take expired OCPs.  You forget to restart the pill on day 7, when using the packs of 21 pills.  Risks associated with oral contraceptive pills Oral contraceptive pills can sometimes cause side effects, such as:  Headache.  Nausea.  Breast tenderness.  Irregular bleeding or spotting.  Combination pills are also associated with a small increased risk of:  Blood clots.  Heart attack.  Stroke.  This information is not intended to replace advice given to you by your health care provider. Make sure you discuss any questions you have with your health care provider. Document Released: 05/03/2002 Document Revised: 07/19/2015 Document Reviewed: 08/01/2012 Elsevier Interactive Patient Education  2018 Elsevier Inc.  

## 2017-04-20 ENCOUNTER — Telehealth: Payer: Self-pay

## 2017-04-20 NOTE — Telephone Encounter (Signed)
Patient called stating she started having a heavy period and she is passing clots. She would like to know if this is normal. Patient recently removed her IUD of 5 years. I have advised patient she may experience some heavy period since switching birth control method. I have advised her to continued to monitored her cycle and if she starts soak a pad per hour she will need to follow up with our office or [facility] after hours.  Patient voice understanding at this time.

## 2017-04-25 ENCOUNTER — Other Ambulatory Visit: Payer: Self-pay

## 2017-04-25 ENCOUNTER — Ambulatory Visit
Admission: EM | Admit: 2017-04-25 | Discharge: 2017-04-25 | Disposition: A | Discharge status: Home / Self Care | Payer: Managed Care, Other (non HMO) | Attending: Family Medicine | Admitting: Family Medicine

## 2017-04-25 DIAGNOSIS — N939 Abnormal uterine and vaginal bleeding, unspecified: Secondary | ICD-10-CM

## 2017-04-25 DIAGNOSIS — N938 Other specified abnormal uterine and vaginal bleeding: Secondary | ICD-10-CM | POA: Diagnosis not present

## 2017-04-25 DIAGNOSIS — N920 Excessive and frequent menstruation with regular cycle: Secondary | ICD-10-CM | POA: Diagnosis not present

## 2017-04-25 LAB — CBC WITH DIFFERENTIAL/PLATELET
Basophils Absolute: 0 10*3/uL (ref 0–0.1)
Basophils Relative: 1 %
EOS PCT: 1 %
Eosinophils Absolute: 0.1 10*3/uL (ref 0–0.7)
HEMATOCRIT: 38 % (ref 35.0–47.0)
HEMOGLOBIN: 13.2 g/dL (ref 12.0–16.0)
LYMPHS ABS: 1.8 10*3/uL (ref 1.0–3.6)
LYMPHS PCT: 25 %
MCH: 30.5 pg (ref 26.0–34.0)
MCHC: 34.8 g/dL (ref 32.0–36.0)
MCV: 87.6 fL (ref 80.0–100.0)
Monocytes Absolute: 0.6 10*3/uL (ref 0.2–0.9)
Monocytes Relative: 8 %
NEUTROS ABS: 4.8 10*3/uL (ref 1.4–6.5)
NEUTROS PCT: 65 %
PLATELETS: 311 10*3/uL (ref 150–440)
RBC: 4.34 MIL/uL (ref 3.80–5.20)
RDW: 12.2 % (ref 11.5–14.5)
WBC: 7.3 10*3/uL (ref 3.6–11.0)

## 2017-04-25 LAB — BASIC METABOLIC PANEL
ANION GAP: 8 (ref 5–15)
BUN: 9 mg/dL (ref 6–20)
CHLORIDE: 103 mmol/L (ref 101–111)
CO2: 23 mmol/L (ref 22–32)
Calcium: 8.7 mg/dL — ABNORMAL LOW (ref 8.9–10.3)
Creatinine, Ser: 0.72 mg/dL (ref 0.44–1.00)
GFR calc Af Amer: >60 mL/min (ref >60)
GLUCOSE: 94 mg/dL (ref 65–99)
POTASSIUM: 3.8 mmol/L (ref 3.5–5.1)
Sodium: 134 mmol/L — ABNORMAL LOW (ref 135–145)

## 2017-04-25 LAB — HCG, QUANTITATIVE, PREGNANCY

## 2017-04-25 MED ORDER — MEDROXYPROGESTERONE ACETATE 5 MG PO TABS: 10.0000 mg | ORAL_TABLET | Freq: Every day | ORAL | 0 refills | Status: DC

## 2017-04-25 NOTE — Discharge Instructions (Signed)
Take medication as prescribed. Rest. Drink plenty of fluids. STOP current contraceptive.  Follow up with your OBGYN this week as discussed.  Return to Urgent care for new or worsening concerns.

## 2017-04-25 NOTE — ED Provider Notes (Signed)
[facility] ____________________________________________  Time seen: Approximately 3:17 PM  I have reviewed the triage vital signs and the nursing notes.   HISTORY  Chief Complaint Vaginal Bleeding   HPI Kathleen Ayala is a 27 y.o. female present with family at bedside for evaluation of vaginal bleeding.  Patient reports she is currently having a lot of vaginal bleeding including passing quarter size clots.  Patient states on February 1 she had her IUD removed after having present for 5 years.  States she then was started a few days later on oral contraceptives.  States after a few weeks of starting the birth control she began having increased vaginal bleeding, initially suspected due to removal of IUD, however reports bleeding has continued.  States over the last few days she has had increase in bleeding with passing of blood clots.  States today she feels like she is having to soak through a pad every hour.  States she has a follow-up with her OB/GYN this coming Tuesday and will have a possible ultrasound at that time.  Patient also reports she has had some intermittent lower pelvic pain, denies any pelvic pain at this time.  States last concern of possible pregnancy conception was right around the time of IUD being taken out.  Denies concerns of STDs.  Denies vaginal discharge.  States some generalized lower abdominal cramping, otherwise denies any pain at this time.  No accompanying fevers, syncope, near syncope, chest pain, shortness breath or palpitations.  Reports she does feel very tired and more fatigued than normal.  States has taken pregnancy tests at home that were negative.  Denies dysuria.  Denies smoking.  No personal or family history of PE or DVTs.  Denies personal cardiac history.  Allie Bossierove, Myra C, MD: [facility]   History reviewed. No pertinent past medical history.  Patient Active Problem List   Diagnosis Date Noted  . Dysplasia of cervix, high grade  CIN 2 05/24/2015    Past Surgical History:  Procedure Laterality Date  . GYNECOLOGIC CRYOSURGERY       No current facility-administered medications for this encounter.   Current Outpatient Medications:  .  medroxyPROGESTERone (PROVERA) 5 MG tablet, Take 2 tablets (10 mg total) by mouth daily for 5 days., Disp: 10 tablet, Rfl: 0  Allergies Patient has no known allergies.  Family History  Problem Relation Age of Onset  . Cancer Other        LUNG  . Hypertension Neg Hx   . Stroke Neg Hx     Social History Social History   Tobacco Use  . Smoking status: Never Smoker  . Smokeless tobacco: Never Used  Substance Use Topics  . Alcohol use: Yes    Comment: social  . Drug use: No    Review of Systems Constitutional: No fever/chills ENT: No sore throat. Cardiovascular: Denies chest pain. Respiratory: Denies shortness of breath. Gastrointestinal: No abdominal pain.  No nausea, no vomiting.  No diarrhea.  No constipation. As above.  Genitourinary: Negative for dysuria Musculoskeletal: Negative for back pain. Skin: Negative for rash.  ____________________________________________   PHYSICAL EXAM:  VITAL SIGNS: ED Triage Vitals  Enc Vitals Group     BP 04/25/17 1314 117/77     Pulse Rate 04/25/17 1314 75     Resp 04/25/17 1314 18     Temp 04/25/17 1314 98.1 F (36.7 C)     Temp Source 04/25/17 1314 Oral     SpO2 04/25/17 1314 100 %  Weight 04/25/17 1315 120 lb (54.4 kg)     Height 04/25/17 1315 5\' 4"  (1.626 m)     Head Circumference --      Peak Flow --      Pain Score --      Pain Loc --      Pain Edu? --      Excl. in GC? --    Orthostatic VS for the past 24 hrs:  BP- Lying Pulse- Lying BP- Sitting Pulse- Sitting BP- Standing at 0 minutes Pulse- Standing at 0 minutes  04/25/17 1454 106/63 75 108/72 80 109/81 98     Constitutional: Alert and oriented. Well appearing and in no acute distress. Cardiovascular: Normal rate, regular rhythm. Grossly normal  heart sounds.  Good peripheral circulation. Respiratory: Normal respiratory effort without tachypnea nor retractions. Breath sounds are clear and equal bilaterally. No wheezes, rales, rhonchi. Gastrointestinal: Soft and nontender. Normal Bowel sounds. No CVA tenderness. Musculoskeletal:  No midline cervical, thoracic or lumbar tenderness to palpation.  Neurologic:  Normal speech and language. No gross focal neurologic deficits are appreciated. Speech is normal. No gait instability.  Skin:  Skin is warm, dry and intact. No rash noted. Psychiatric: Mood and affect are normal. Speech and behavior are normal. Patient exhibits appropriate insight and judgment   ___________________________________________   LABS (all labs ordered are listed, but only abnormal results are displayed)  Labs Reviewed  BASIC METABOLIC PANEL - Abnormal; Notable for the following components:      Result Value   Sodium 134 (*)    Calcium 8.7 (*)    All other components within normal limits  HCG, QUANTITATIVE, PREGNANCY  CBC WITH DIFFERENTIAL/PLATELET    PROCEDURES Procedures    INITIAL IMPRESSION / ASSESSMENT AND PLAN / ED COURSE  Pertinent labs & imaging results that were available during my care of the patient were reviewed by me and considered in my medical decision making (see chart for details).  Well-appearing patient.  No acute distress.  HCG negative.  Patient hemodynamically stable.  Patient with continued vaginal bleeding after IUD removal and starting on oral contraceptive.  Patient has follow-up with OB/GYN this coming Tuesday.  Discussed very strict follow-up and return parameters.  Recommend follow-up with OB/GYN for pelvic ultrasound.  Discussed with patient, felt that patient is stable at this time for outpatient management, however for any worsening complaints proceeding directly to emergency room.  Discussed with patient to stop current oral contraceptive and will start patient on oral Provera 10  mg daily times 5 days and keep follow-up appointment this week.  Encourage supportive care and pelvic rest.   Discussed follow up and return parameters including no resolution or any worsening concerns. Patient verbalized understanding and agreed to plan.   ____________________________________________   FINAL CLINICAL IMPRESSION(S) / ED DIAGNOSES  Final diagnoses:  Excessive vaginal bleeding     ED Discharge Orders        Ordered    medroxyPROGESTERone (PROVERA) 5 MG tablet  Daily     04/25/17 1530       Note: This dictation was prepared with Dragon dictation along with smaller phrase technology. Any transcriptional errors that result from this process are unintentional.         Renford Dills, NP 04/25/17 1911

## 2017-04-25 NOTE — ED Triage Notes (Addendum)
Pt reports her IUD was taken out on Feb 1st. Started on oral contraceptives on Feb 4th. Pt reports she has had heavy vaginal bleeding during 3rd week of the new pills. Started bleeding Feb 22nd. Now with clots and bleeding through one pad per hour. Talked to OBGYN office yesterday and is scheduled to go in on Tuesday for checkup and Ultrasound. Reports she was having intermittent right sided pelvic pain. Pt now feeling lightheaded. Pt has taken 4 pregnancy tests and all were negative.

## 2017-04-27 ENCOUNTER — Encounter: Payer: Self-pay | Admitting: Obstetrics & Gynecology

## 2017-04-27 ENCOUNTER — Ambulatory Visit (INDEPENDENT_AMBULATORY_CARE_PROVIDER_SITE_OTHER): Payer: Managed Care, Other (non HMO) | Admitting: Obstetrics & Gynecology

## 2017-04-27 VITALS — BP 109/71 | HR 97 | Ht 64.0 in | Wt 123.0 lb

## 2017-04-27 DIAGNOSIS — Z3202 Encounter for pregnancy test, result negative: Secondary | ICD-10-CM | POA: Diagnosis not present

## 2017-04-27 DIAGNOSIS — N921 Excessive and frequent menstruation with irregular cycle: Secondary | ICD-10-CM

## 2017-04-27 DIAGNOSIS — Z30430 Encounter for insertion of intrauterine contraceptive device: Secondary | ICD-10-CM

## 2017-04-27 LAB — POCT URINE PREGNANCY: PREG TEST UR: NEGATIVE

## 2017-04-27 MED ORDER — MEGESTROL ACETATE 40 MG PO TABS: 40.0000 mg | ORAL_TABLET | Freq: Two times a day (BID) | ORAL | 5 refills | Status: DC

## 2017-04-27 MED ORDER — LEVONORGESTREL 20 MCG/24HR IU IUD
INTRAUTERINE_SYSTEM | Freq: Once | INTRAUTERINE | Status: AC
  Administered 2017-04-27: 16:00:00 via INTRAUTERINE

## 2017-04-27 NOTE — Progress Notes (Signed)
Vaginal bleeding since 04/17/17. Flow increased on 04/23/17 having to change pads 1-2 hours. Went to urgent care over the weekend and was told she is not anemic. UC started her on medroxyprogesterone 5mg  bid and she is on day 2. She stattes she has no energy, has dizzy ness, and "does not feel normal".  Mirena removed 03/27/17

## 2017-04-27 NOTE — Progress Notes (Signed)
Patient ID: Kathleen PorteousJessica M Mann, female   DOB: 12/05/1990, 27 y.o.   MRN: [id number]  Chief Complaint  Patient presents with  . Menorrhagia    HPI Kathleen PorteousJessica M Mann is a 27 y.o. female. Engaged P1 (28 yo daughter) here with very very heavy periods. She had no periods when she had Mirena for 5 years. She had it removed when it expired and then tried OCPs but started very heavy bleeding after 3 weeks so she stopped them. She has NOT been having sex due to the very heavy bleeding. She went to Urgent Care 2 days ago and was given provera 5 mg daily. She is STILL having heavy bleeding, feeling very fatigued. HPI  History reviewed. No pertinent past medical history.  Past Surgical History:  Procedure Laterality Date  . GYNECOLOGIC CRYOSURGERY      Family History  Problem Relation Age of Onset  . Cancer Other        LUNG  . Hypertension Neg Hx   . Stroke Neg Hx     Social History Social History   Tobacco Use  . Smoking status: Never Smoker  . Smokeless tobacco: Never Used  Substance Use Topics  . Alcohol use: Yes    Comment: social  . Drug use: No    No Known Allergies  Current Outpatient Medications  Medication Sig Dispense Refill  . medroxyPROGESTERone (PROVERA) 5 MG tablet Take 2 tablets (10 mg total) by mouth daily for 5 days. 10 tablet 0  . norethindrone-ethinyl estradiol (OVCON-35,BALZIVA,BRIELLYN) 0.4-35 MG-MCG tablet Take 1 tablet by mouth daily.     No current facility-administered medications for this visit.     Review of Systems Review of Systems  Blood pressure 109/71, pulse 97, height 5\' 4"  (1.626 m), weight 123 lb (55.8 kg), last menstrual period 04/17/2017.  Physical Exam Physical Exam Weak White woman Breathing, conversing, and ambulating normally UPT negative, consent signed, Time out procedure done. Cervix prepped with betadine and grasped with a single tooth tenaculum. Mirena was easily placed and the strings were cut to 3-4 cm. Uterus sounded to 9 cm. She  tolerated the procedure well. Heavy vaginal bleeding noted, with clots   Data Reviewed Edited Result - FINAL Visible to patient:  Yes (MyChart) Next appt:  None Dx:  Well woman exam with routine gynecolo...  Component 3211yr ago  Adequacy Satisfactory for evaluation endocervical/transformation zone component PRESENT.   Diagnosis NEGATIVE FOR INTRAEPITHELIAL LESIONS OR MALIGNANCY.   Diagnosis FUNGAL ORGANISMS PRESENT CONSISTENT WITH CANDIDA SPP.   Material Submitted CervicoVaginal Pap [ThinPrep Imaged]   CYTOLOGY - PAP PAP RESULT   Resulting Agency [facility]      Specimen Collected: 04/03/16 00:00 Last Resulted: 04/07/16 00:00         Assessment    Menorrhagia- Mirena placed today Also treat with megace 40 mg BID    Plan    Check cbc, tsh Come back 2 weeks Get pap smear when not bleeding       Ulrich Soules C Jenean Escandon 04/27/2017, 4:11 PM

## 2017-04-28 ENCOUNTER — Ambulatory Visit: Payer: Managed Care, Other (non HMO) | Admitting: Family Medicine

## 2017-04-28 LAB — CBC
Hematocrit: 31.7 % — ABNORMAL LOW (ref 34.0–46.6)
Hemoglobin: 10.4 g/dL — ABNORMAL LOW (ref 11.1–15.9)
MCH: 29.5 pg (ref 26.6–33.0)
MCHC: 32.8 g/dL (ref 31.5–35.7)
MCV: 90 fL (ref 79–97)
PLATELETS: 316 10*3/uL (ref 150–379)
RBC: 3.53 x10E6/uL — AB (ref 3.77–5.28)
RDW: 12.7 % (ref 12.3–15.4)
WBC: 7.6 10*3/uL (ref 3.4–10.8)

## 2017-04-28 LAB — TSH: TSH: 2.11 u[IU]/mL (ref 0.450–4.500)

## 2017-05-22 ENCOUNTER — Encounter: Payer: Self-pay | Admitting: Obstetrics & Gynecology

## 2017-05-22 ENCOUNTER — Ambulatory Visit (INDEPENDENT_AMBULATORY_CARE_PROVIDER_SITE_OTHER): Payer: Managed Care, Other (non HMO) | Admitting: Obstetrics & Gynecology

## 2017-05-22 ENCOUNTER — Ambulatory Visit: Payer: Managed Care, Other (non HMO) | Admitting: Obstetrics & Gynecology

## 2017-05-22 VITALS — BP 135/77 | HR 105 | Wt 120.0 lb

## 2017-05-22 DIAGNOSIS — N921 Excessive and frequent menstruation with irregular cycle: Secondary | ICD-10-CM

## 2017-05-22 DIAGNOSIS — R87613 High grade squamous intraepithelial lesion on cytologic smear of cervix (HGSIL): Secondary | ICD-10-CM | POA: Diagnosis not present

## 2017-05-22 DIAGNOSIS — Z30431 Encounter for routine checking of intrauterine contraceptive device: Secondary | ICD-10-CM

## 2017-05-22 DIAGNOSIS — Z124 Encounter for screening for malignant neoplasm of cervix: Secondary | ICD-10-CM | POA: Diagnosis not present

## 2017-05-22 NOTE — Progress Notes (Signed)
See other note

## 2017-05-22 NOTE — Progress Notes (Signed)
   Subjective:    Patient ID: Kathleen PorteousJessica M Ayala, female    DOB: 01/07/1991, 27 y.o.   MRN: [id number]  HPI 27 yo engaged [place]28 yo daughter) here for IUD check. She also needs a pap smear (h/o HGSIL 2017). Her bleeding has completely resolved.    Review of Systems     Objective:   Physical Exam Breathing, conversing, and ambulating normally Well nourished, well hydrated White female, no apparent distress IUD string seen (about 2 cm long) Pap obtained     Assessment & Plan:  History of HGSIL- pap with reflex cotesting Contraception/menorrhagia- Continue Liletta

## 2017-05-25 LAB — CYTOLOGY - PAP: DIAGNOSIS: NEGATIVE

## 2017-12-08 ENCOUNTER — Ambulatory Visit (INDEPENDENT_AMBULATORY_CARE_PROVIDER_SITE_OTHER): Payer: Managed Care, Other (non HMO) | Admitting: Nurse Practitioner

## 2017-12-08 ENCOUNTER — Encounter: Payer: Self-pay | Admitting: Nurse Practitioner

## 2017-12-08 ENCOUNTER — Other Ambulatory Visit: Payer: Self-pay

## 2017-12-08 VITALS — BP 118/69 | HR 86 | Temp 98.4°F | Ht 64.0 in | Wt 126.6 lb

## 2017-12-08 DIAGNOSIS — H6501 Acute serous otitis media, right ear: Secondary | ICD-10-CM

## 2017-12-08 DIAGNOSIS — Z7689 Persons encountering health services in other specified circumstances: Secondary | ICD-10-CM | POA: Diagnosis not present

## 2017-12-08 MED ORDER — AMOXICILLIN-POT CLAVULANATE 875-125 MG PO TABS: 1.0000 | ORAL_TABLET | Freq: Two times a day (BID) | ORAL | 0 refills | Status: AC

## 2017-12-08 MED ORDER — FLUTICASONE PROPIONATE 50 MCG/ACT NA SUSP: 2.0000 | Freq: Every day | NASAL | 6 refills | Status: DC

## 2017-12-08 NOTE — Progress Notes (Signed)
Subjective:    Patient ID: Kathleen Ayala, female    DOB: May 07, 1990, 27 y.o.   MRN: [id number]  Kathleen Ayala is a 27 y.o. female presenting on 12/08/2017 for Establish Care (sinus drainage, Right ear fullness, dizziness  x 3 days. Pt reports  recent sinus infection x 3 mths ago )   HPI Establish Care New Provider Pt last seen by PCP many years ago.  Obtain records from [facility] for recent OBGYN care.    Sinus infections Patient with onset of new symptoms about 3 days ago.  Started with thhroat burning Satudray.  Is taking Nyquil, dayquil without significant relief.  - Worsened today with significant ear pressure today.  Had onset of acute dizziness suddenly after blowing nose and feeling like her ear pressure either increased or decreased suddenly.  Took about 40 minutes for resolution. - 5 months ago had bad sinus infection.  None prior. - significant post-nasal drip also associated with today's symptoms.  History reviewed. No pertinent past medical history. Past Surgical History:  Procedure Laterality Date  . GYNECOLOGIC CRYOSURGERY     Social History   Socioeconomic History  . Marital status: Married    Spouse name: Not on file  . Number of children: Not on file  . Years of education: Not on file  . Highest education level: Some college, no degree  Occupational History  . Not on file  Social Needs  . Financial resource strain: Not on file  . Food insecurity:    Worry: Not on file    Inability: Not on file  . Transportation needs:    Medical: Not on file    Non-medical: Not on file  Tobacco Use  . Smoking status: Never Smoker  . Smokeless tobacco: Never Used  Substance and Sexual Activity  . Alcohol use: Yes    Comment: social  . Drug use: No  . Sexual activity: Yes    Partners: Male    Birth control/protection: IUD  Lifestyle  . Physical activity:    Days per week: Not on file    Minutes per session: Not on file  . Stress: Not on file  Relationships  . Social  connections:    Talks on phone: Not on file    Gets together: Not on file    Attends religious service: Not on file    Active member of club or organization: Not on file    Attends meetings of clubs or organizations: Not on file    Relationship status: Not on file  . Intimate partner violence:    Fear of current or ex partner: No    Emotionally abused: No    Physically abused: No    Forced sexual activity: No  Other Topics Concern  . Not on file  Social History Narrative  . Not on file   Family History  Problem Relation Age of Onset  . Cancer Other        LUNG  . Healthy Mother   . Healthy Father   . Diabetes Maternal Grandmother   . Drug abuse Maternal Grandfather   . Hypertension Neg Hx   . Stroke Neg Hx    Current Outpatient Medications on File Prior to Visit  Medication Sig  . levonorgestrel (MIRENA) 20 MCG/24HR IUD 1 each by Intrauterine route once.  . megestrol (MEGACE) 40 MG tablet Take 1 tablet (40 mg total) by mouth 2 (two) times daily. (Patient not taking: Reported on 12/08/2017)   No current  facility-administered medications on file prior to visit.     Review of Systems Per HPI unless specifically indicated above     Objective:    BP 118/69   Pulse 86   Temp 98.4 F (36.9 C) (Oral)   Ht 5\' 4"  (1.626 m)   Wt 126 lb 9.6 oz (57.4 kg)   BMI 21.73 kg/m   Wt Readings from Last 3 Encounters:  12/08/17 126 lb 9.6 oz (57.4 kg)  05/22/17 120 lb (54.4 kg)  04/27/17 123 lb (55.8 kg)    Physical Exam  Constitutional: She is oriented to person, place, and time. She appears well-developed and well-nourished. No distress.  HENT:  Head: Normocephalic and atraumatic.  Right Ear: Hearing, external ear and ear canal normal. Tympanic membrane is injected, erythematous and bulging.  Left Ear: Hearing, tympanic membrane, external ear and ear canal normal.  Nose: Mucosal edema and rhinorrhea present. Right sinus exhibits maxillary sinus tenderness and frontal sinus  tenderness. Left sinus exhibits maxillary sinus tenderness and frontal sinus tenderness.  Mouth/Throat: Uvula is midline and mucous membranes are normal. Oropharyngeal exudate (clear secretions), posterior oropharyngeal edema (cobblestoning) and posterior oropharyngeal erythema (mildly injected) present. Tonsils are 0 on the right. Tonsils are 0 on the left.  Eyes: Pupils are equal, round, and reactive to light. Conjunctivae and EOM are normal. Right eye exhibits no discharge. Left eye exhibits no discharge.  Neck: Normal range of motion. Neck supple.  Cardiovascular: Normal rate, regular rhythm, S1 normal, S2 normal, normal heart sounds and intact distal pulses.  Pulmonary/Chest: Effort normal and breath sounds normal. No respiratory distress.  Lymphadenopathy:       Head (right side): Submandibular and preauricular adenopathy present.    She has cervical adenopathy.       Right cervical: Superficial cervical adenopathy present.       Left cervical: Superficial cervical adenopathy present.  Neurological: She is alert and oriented to person, place, and time.  Skin: Skin is warm and dry. Capillary refill takes less than 2 seconds.  Psychiatric: She has a normal mood and affect. Her behavior is normal.  Vitals reviewed.  Results for orders placed or performed in visit on 05/22/17  Cytology - PAP  Result Value Ref Range   Adequacy      Satisfactory for evaluation  endocervical/transformation zone component PRESENT.   Diagnosis      NEGATIVE FOR INTRAEPITHELIAL LESIONS OR MALIGNANCY.   Material Submitted CervicoVaginal Pap [ThinPrep Imaged]       Assessment & Plan:   Problem List Items Addressed This Visit    None    Visit Diagnoses    Non-recurrent acute serous otitis media of right ear    -  Primary Consistent with R AOM on exam without effusion or perforation, in setting of prior URI symptoms with no known sick contacts (URI). Last URI or antibiotics 5 months ago. Currently afebrile,  well-appearing and non-toxic, well hydrated on exam.  Also with eustachian tube dysfunction in setting of sinus congestion.   Plan: 1.START taking Augmentin 875-125mg  tablets every 12 hours for 10 days.  Discussed completing antibiotic. - While on antibiotic, take a probiotic OTC or from food. - Start Atrovent nasal spray decongestant 2 sprays each nostril up to 4 times daily for 5-7 days - Continue anti-histamine loratadine 10mg  daily. - Can use Flonase 2 sprays each nostril daily for up to 4-6 weeks if no epistaxis. - Start Mucinex-DM OTC for  7-10 days prn congestion 2. Supportive care with nasal  saline, warm herbal tea with honey, 3. Improve hydration 4. Tylenol / Motrin PRN fevers  5. Return criteria given      Relevant Medications   fluticasone (FLONASE) 50 MCG/ACT nasal spray   amoxicillin-clavulanate (AUGMENTIN) 875-125 MG tablet   Encounter to establish care     Previous [facility] received [facility].  Records are reviewed in clinic.  Past medical, family, and surgical history reviewed w/ patient in clinic today.        Meds ordered this encounter  Medications  . fluticasone (FLONASE) 50 MCG/ACT nasal spray    Sig: Place 2 sprays into both nostrils daily.    Dispense:  16 g    Refill:  6    Order Specific Question:   Supervising Provider    Answer:   Smitty Cords [[id number]]  . amoxicillin-clavulanate (AUGMENTIN) 875-125 MG tablet    Sig: Take 1 tablet by mouth 2 (two) times daily for 10 days.    Dispense:  20 tablet    Refill:  0    Order Specific Question:   Supervising Provider    Answer:   Smitty Cords [[id number]]     Follow up plan: Return 5-7 days if symptoms worsen or fail to improve.  Wilhelmina Mcardle, DNP, AGPCNP-BC Adult Gerontology Primary Care Nurse Practitioner [facility] 12/08/2017, 10:10 PM

## 2017-12-08 NOTE — Patient Instructions (Addendum)
Kathleen Ayala,   Thank you for coming in to clinic today.  1. RIGHT ear infection after increased sinus congestion. Recommend good hand washing. - START Augmentin 875-125 mg one tablet every 12 hours for 10 days.  Make sure to take all doses of your antibiotic. - While you are on an antibiotic, take a probiotic.  Antibiotics kill good and bad bacteria.  A probiotic helps to replace your good bacteria. Probiotic pills can be found over the counter.  One brand is Florastor, but you can use any brand you prefer.  You can also get good bacteria from foods.  These foods are yogurt, kefir, kombucha, and fresh, refrigerated and uncooked sauerkraut. - Drink plenty of fluids.  - Start anti-histamine loratadine or cetirizine 10mg  daily. - You may also use Flonase 2 sprays each nostril daily for up to 4-6 weeks - You may also use behind the counter Sudafed or pseudoephedrine for about 2-3 days if needed.  Other over the counter medications you may try, if needed for symptoms are: - If congestion is worse, start OTC Mucinex (or may try Mucinex-DM for cough) up to 7-10 days then stop - You may try over the counter Nasal Saline spray (Simply Saline, Ocean Spray) as needed to reduce congestion. - Start taking Tylenol extra strength 1 to 2 tablets every 6-8 hours for aches or fever/chills for next few days as needed.  Do not take more than 3,000 mg in 24 hours from all medicines.  You may also take ibuprofen 200-400mg  every 8 hours as needed.   - Drink warm herbal tea with honey for sore throat.   If symptoms are significantly worse with persistent fevers/chills despite tylenol/ibpurofen, nausea, vomiting unable to tolerate food/fluids or medicine, body aches, or shortness of breath, sinus pain pressure or worsening productive cough, then follow-up for re-evaluation, may seek more immediate care at Urgent Care or the ED if you are more concerned that it is an emergency.  Please schedule a follow-up appointment  with Wilhelmina Mcardle, AGNP. Return 5-7 days if symptoms worsen or fail to improve.  If you have any other questions or concerns, please feel free to call the clinic or send a message through MyChart. You may also schedule an earlier appointment if necessary.  You will receive a survey after today's visit either digitally by e-mail or paper by [employer]. Your experiences and feedback matter to [place].  Please respond so we know how we are doing as we provide care for you.   Wilhelmina Mcardle, DNP, AGNP-BC Adult Gerontology Nurse Practitioner [facility], [facility]

## 2018-04-09 ENCOUNTER — Telehealth: Payer: Self-pay | Admitting: Nurse Practitioner

## 2018-04-09 NOTE — Telephone Encounter (Signed)
Pt has a question about recent lab work that was done for life insurance.  Please call [phone]

## 2018-04-09 NOTE — Telephone Encounter (Signed)
The pt was notified that she need to schedule an appt.

## 2018-09-13 ENCOUNTER — Other Ambulatory Visit: Payer: Self-pay

## 2018-09-13 DIAGNOSIS — Z20822 Contact with and (suspected) exposure to covid-19: Secondary | ICD-10-CM

## 2018-09-15 LAB — NOVEL CORONAVIRUS, NAA: SARS-CoV-2, NAA: NOT DETECTED

## 2018-12-10 ENCOUNTER — Ambulatory Visit: Payer: Self-pay | Admitting: Primary Care

## 2018-12-21 ENCOUNTER — Ambulatory Visit: Payer: Self-pay | Admitting: Primary Care

## 2019-01-10 ENCOUNTER — Encounter: Payer: Self-pay | Admitting: Radiology

## 2019-02-22 ENCOUNTER — Ambulatory Visit (INDEPENDENT_AMBULATORY_CARE_PROVIDER_SITE_OTHER): Payer: Managed Care, Other (non HMO) | Admitting: Obstetrics & Gynecology

## 2019-02-22 ENCOUNTER — Other Ambulatory Visit: Payer: Self-pay

## 2019-02-22 ENCOUNTER — Encounter: Payer: Self-pay | Admitting: Obstetrics & Gynecology

## 2019-02-22 VITALS — BP 113/75 | HR 90 | Wt 137.0 lb

## 2019-02-22 DIAGNOSIS — Z30432 Encounter for removal of intrauterine contraceptive device: Secondary | ICD-10-CM

## 2019-02-22 DIAGNOSIS — Z124 Encounter for screening for malignant neoplasm of cervix: Secondary | ICD-10-CM

## 2019-02-22 DIAGNOSIS — Z01419 Encounter for gynecological examination (general) (routine) without abnormal findings: Secondary | ICD-10-CM

## 2019-02-22 NOTE — Progress Notes (Signed)
Subjective:    Kathleen Ayala is a 28 y.o. married P1 (52 yo daughter)  who presents for an annual exam. The patient has no complaints today. She would like to conceive. She is already taking PNVs and iron daily. She will need her IUD removed.  The patient is sexually active. GYN screening history: last pap: was normal. The patient wears seatbelts: yes. The patient participates in regular exercise: yes. Has the patient ever been transfused or tattooed?: no. The patient reports that there is not domestic violence in her life.   Menstrual History: OB History    Gravida  1   Para  1   Term  1   Preterm      AB      Living  1     SAB      TAB      Ectopic      Multiple      Live Births  1           Menarche age: 65 No LMP recorded. (Menstrual status: IUD).    The following portions of the patient's history were reviewed and updated as appropriate: allergies, current medications, past family history, past medical history, past social history, past surgical history and problem list.  Review of Systems Pertinent items are noted in HPI.   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 [employer]- no breast/gyn/colon cancer   Objective:    BP 113/75   Pulse 90   Wt 137 lb (62.1 kg)   BMI 23.52 kg/m   General Appearance:    Alert, cooperative, no distress, appears stated age  Head:    Normocephalic, without obvious abnormality, atraumatic  Eyes:    PERRL, conjunctiva/corneas clear, EOM's intact, fundi    benign, both eyes  Ears:    Normal TM's and external ear canals, both ears  Nose:   Nares normal, septum midline, mucosa normal, no drainage    or sinus tenderness  Throat:   Lips, mucosa, and tongue normal; teeth and gums normal  Neck:   Supple, symmetrical, trachea midline, no adenopathy;    thyroid:  no enlargement/tenderness/nodules; no carotid   bruit or JVD  Back:     Symmetric, no curvature, ROM normal, no CVA tenderness  Lungs:     Clear to  auscultation bilaterally, respirations unlabored  Chest Wall:    No tenderness or deformity   Heart:    Regular rate and rhythm, S1 and S2 normal, no murmur, rub   or gallop  Breast Exam:    No tenderness, masses, or nipple abnormality  Abdomen:     Soft, non-tender, bowel sounds active all four quadrants,    no masses, no organomegaly  Genitalia:    Normal female without lesion, discharge or tenderness, normal size and shape, anteverted, mobile, non-tender, normal adnexal exam Intact IUD easily removed     Extremities:   Extremities normal, atraumatic, no cyanosis or edema  Pulses:   2+ and symmetric all extremities  Skin:   Skin color, texture, turgor normal, no rashes or lesions  Lymph nodes:   Cervical, supraclavicular, and axillary nodes normal  Neurologic:   CNII-XII intact, normal strength, sensation and reflexes    throughout  .    Assessment:    Healthy female exam.    Plan:     Thin prep Pap smear.   Continue PNVs

## 2019-02-22 NOTE — Addendum Note (Signed)
Addended by: Phill Myron on: 02/22/2019 09:25 AM   Modules accepted: Orders

## 2019-02-22 NOTE — Progress Notes (Signed)
Patient desires to conceive and would like IUD removed. Last pap 05-22-17 WNL - hx of abnormal pap.  Kathrene Alu RN

## 2019-02-23 LAB — CYTOLOGY - PAP: Diagnosis: NEGATIVE

## 2019-02-25 NOTE — L&D Delivery Note (Addendum)
OB/GYN Faculty Practice Delivery Note  Kathleen Ayala is a 28 y.o. [id number] s/p vaginal delivery at [redacted]w[redacted]d. She was admitted for elective induction of labor.   ROM: 2h 55m SROM with clear fluid at 1610 GBS Status: Negative Maximum Maternal Temperature: 98.47F  Labor Progress: Progressed well s/p cytotec and pitocin. No fetal distress noted. Patient tolerated labor well.   Delivery Date/Time: 01/27/2020 at 1908 Delivery: Called to room and patient was complete and pushing. Head delivered ROA. No nuchal cord present. Shoulder and body delivered using McRoberts, suprapubic pressure and delivery of the posterior arm. Infant with spontaneous cry, placed on mother's abdomen, dried and stimulated. Cord clamped x 2 after 1-minute delay, and cut by FOB Ronaldo Miyamoto). Cord blood drawn. Placenta delivered spontaneously, intact, with 3-vessel cord. Initial uterine atony with pitocin. TXA ordered and administered. Vaginal sweep performed. Cytotec 800 mg rectal administered. Fundus firm with massage s/p medication. Dr. Earlene Plater called to bedside. Vaginal sweep performed by Dr. Earlene Plater. Labia, perineum, vagina, and cervix inspected, 2nd degree laceration found and repaired with 3-0 Monocryl CT-1. Called Code Hemorrhage.  Bleeding improved s/p vaginal sweep, laceration repair and PPH medications. Patient stable at this time.   Placenta: spontaneous, intact at 1917 Complications: PPH  Lacerations: 2nd degree with repair. Repaired with 3-0 Monocryl CT  EBL: 1500 Analgesia: Epidural  Postpartum Planning [x]  transfer orders to MB [x]  discharge summary started & shared [x]  message to sent to schedule follow-up  [x]  lists updated [x]  vaccines UTD  Infant: girl  APGARs 8/9  weight pending  , [facility] for [facility] 01/27/2020, 7:51 PM    I was present and gloved with the student and agree with the note above.   DNP, CNM  01/27/20  8:08 PM

## 2019-03-28 ENCOUNTER — Encounter: Payer: Self-pay | Admitting: Family Medicine

## 2019-03-28 ENCOUNTER — Other Ambulatory Visit: Payer: Self-pay

## 2019-03-28 ENCOUNTER — Ambulatory Visit: Payer: Managed Care, Other (non HMO) | Attending: Internal Medicine

## 2019-03-28 ENCOUNTER — Ambulatory Visit (INDEPENDENT_AMBULATORY_CARE_PROVIDER_SITE_OTHER): Payer: Managed Care, Other (non HMO) | Admitting: Family Medicine

## 2019-03-28 VITALS — Temp 99.0°F

## 2019-03-28 DIAGNOSIS — J0100 Acute maxillary sinusitis, unspecified: Secondary | ICD-10-CM | POA: Diagnosis not present

## 2019-03-28 DIAGNOSIS — Z20822 Contact with and (suspected) exposure to covid-19: Secondary | ICD-10-CM

## 2019-03-28 DIAGNOSIS — H9201 Otalgia, right ear: Secondary | ICD-10-CM

## 2019-03-28 MED ORDER — AMOXICILLIN-POT CLAVULANATE 875-125 MG PO TABS: 1.0000 | ORAL_TABLET | Freq: Two times a day (BID) | ORAL | 0 refills | Status: DC

## 2019-03-28 NOTE — Patient Instructions (Addendum)
Start Augmentin BID x 10 day course for now Start nasal steroid Flonase 2 sprays in each nostril daily for 4-6 weeks, may repeat course seasonally or as needed (has existing rx) May use OTC decongestant sudafed if needed   REQUIRED self quarantine to AVOID POTENTIAL SPREAD - advised to avoid all exposure with others while during TESTING (Pending result) and treatment. Should continue to quarantine for up to 7-14 days - pending resolution of symptoms, if TEST IS NEGATIVE and symptoms resolve by 7 days and is afebrile >3 days - may STOP self quarantine at that time. IF test is POSITIVE then will require 10 additional day quarantine after date of positive test result.    If symptoms do not resolve or significantly improve OR if WORSENING - fever / cough - or worsening shortness of breath - then should contact us and seek advice on next steps in treatment at home vs where/when to seek care at Urgent Care or Hospital ED for further intervention and possible testing if indicated.  Please schedule a Follow-up Appointment to: Return in about 1 week (around 04/04/2019), or if symptoms worsen or fail to improve, for sinusitis.  If you have any other questions or concerns, please feel free to call the office or send a message through MyChart. You may also schedule an earlier appointment if necessary.  Additionally, you may be receiving a survey about your experience at our office within a few days to 1 week by e-mail or mail. We value your feedback.  Saralyn Pilar, DO [facility], [place]

## 2019-03-28 NOTE — Progress Notes (Signed)
Virtual Visit via Telephone The purpose of this virtual visit is to provide medical care while limiting exposure to the novel coronavirus (COVID19) for both patient and office staff.  Consent was obtained for phone visit:  Yes.   Answered questions that patient had about telehealth interaction:  Yes.   I discussed the limitations, risks, security and privacy concerns of performing an evaluation and management service by telephone. I also discussed with the patient that there may be a patient responsible charge related to this service. The patient expressed understanding and agreed to proceed.  Patient Location: Home Provider Location: [facility])   ---------------------------------------------------------------------- Chief Complaint  Patient presents with  . Ear Pain    Rt ear throbbing and fulllness, scratchy throat, w/ low grade fever 99.0, and severe headache that she contributes to change in altitude. She was snow tubing this weekend in the mountains. She went and got COVID tested this morning.     S: Reviewed CMA documentation. I have called patient and gathered additional HPI as follows:  SINUSITIS / Ear Pain Reports that symptoms started 2 days ago while on vacation snow tubing etc, she developed symptoms worse with altitude pressure as well, R side ear pain throbbing fullness and pain. She doesn't have significant congestion drainage but has sore irritated scratchy throat. - She mostly works from home. She did do a COVID19 test today at [facility] earlier this morning, pending result. - In past she has had prior very similar scenario with sinus and ear infection worse with altitude and flying on planes or in this case on mountain trip, she has required antibiotic in very similar situation in past, and says it feels very similar.  Denies any high risk travel to areas of current concern for COVID19. Denies any known or suspected exposure to person with or possibly  with COVID19.  Denies any fevers, chills, sweats, body ache, cough, shortness of breath, abdominal pain, diarrhea  -------------------------------------------------------------------------- O: No physical exam performed due to remote telephone encounter.  -------------------------------------------------------------------------- A&P:  Suspected Acute Sinusitis, possible for benign viral etiology at onset - now concern with progression of symptoms, consider 2nd sickening and cannot rule out bacterial infection. - Reassuring without high risk symptoms - Afebrile, without dyspnea - No comorbid pulmonary conditions (asthma, COPD) or immunocompromise  1. Agree with COVID19 test today already performed at [facility], pending result 2. Given her known prior history of very similar sinusitis vs otitis media in this setting with prior travel, will provide empiric Augmentin BID x 10 day course for now 3. Start nasal steroid Flonase 2 sprays in each nostril daily for 4-6 weeks, may repeat course seasonally or as needed (has existing rx) 4. May use OTC decongestant sudafed if needed  Meds ordered this encounter  Medications  . amoxicillin-clavulanate (AUGMENTIN) 875-125 MG tablet    Sig: Take 1 tablet by mouth 2 (two) times daily. For 10 days    Dispense:  20 tablet    Refill:  0   REQUIRED self quarantine to [place] - advised to avoid all exposure with others while during TESTING (Pending result) and treatment. Should continue to quarantine for up to 7-14 days - pending resolution of symptoms, if TEST IS NEGATIVE and symptoms resolve by 7 days and is afebrile >3 days - may STOP self quarantine at that time. IF test is POSITIVE then will require 10 additional day quarantine after date of positive test result.    If symptoms do not resolve or  significantly improve OR if WORSENING - fever / cough - or worsening shortness of breath - then should contact us and seek advice on next steps in  treatment at home vs where/when to seek care at Urgent Care or Hospital ED for further intervention and possible testing if indicated.  Patient verbalizes understanding with the above medical recommendations including the limitation of remote medical advice.  Specific follow-up / call-back criteria were given for patient to follow-up or seek medical care more urgently if needed.   - Time spent in direct consultation with patient on phone: 7 minutes   Saralyn Pilar, DO [facility] 03/28/2019, 1:47 PM

## 2019-03-29 LAB — NOVEL CORONAVIRUS, NAA: SARS-CoV-2, NAA: NOT DETECTED

## 2019-07-06 ENCOUNTER — Ambulatory Visit (INDEPENDENT_AMBULATORY_CARE_PROVIDER_SITE_OTHER): Payer: Managed Care, Other (non HMO) | Admitting: Advanced Practice Midwife

## 2019-07-06 ENCOUNTER — Encounter: Payer: Self-pay | Admitting: Advanced Practice Midwife

## 2019-07-06 ENCOUNTER — Other Ambulatory Visit: Payer: Self-pay

## 2019-07-06 ENCOUNTER — Other Ambulatory Visit (HOSPITAL_COMMUNITY)
Admission: RE | Admit: 2019-07-06 | Discharge: 2019-07-06 | Disposition: A | Discharge status: Home / Self Care | Payer: Managed Care, Other (non HMO) | Source: Ambulatory Visit | Attending: Advanced Practice Midwife | Admitting: Advanced Practice Midwife

## 2019-07-06 VITALS — BP 118/74 | HR 70 | Wt 141.0 lb

## 2019-07-06 DIAGNOSIS — Z3481 Encounter for supervision of other normal pregnancy, first trimester: Secondary | ICD-10-CM

## 2019-07-06 DIAGNOSIS — Z8742 Personal history of other diseases of the female genital tract: Secondary | ICD-10-CM

## 2019-07-06 DIAGNOSIS — Z3480 Encounter for supervision of other normal pregnancy, unspecified trimester: Secondary | ICD-10-CM | POA: Insufficient documentation

## 2019-07-06 DIAGNOSIS — Z3A10 10 weeks gestation of pregnancy: Secondary | ICD-10-CM

## 2019-07-06 MED ORDER — DOCUSATE SODIUM 100 MG PO CAPS
100.0000 mg | ORAL_CAPSULE | Freq: Two times a day (BID) | ORAL | 2 refills | Status: DC | PRN
Start: 2019-07-06 — End: 2020-01-04

## 2019-07-06 NOTE — Patient Instructions (Signed)

## 2019-07-06 NOTE — Progress Notes (Signed)
History:   Kathleen Ayala is a 29 y.o. G2P1001 at [redacted]w[redacted]d by LMP c/w 10 week ultrasound being seen today for her first obstetrical visit.  Her obstetrical history is uncomplicated: term svd x 1 following spontaneous onset of labor. Patient does intend to breast feed. Pregnancy history fully reviewed.  Patient reports fatigue and nausea. She is in a different relationship than with her previous baby and is very excited about their pregnancy. She endorses feeling happy and supported.  A close friend recently had a traumatic birth involving a shoulder dystocia, stat cesarean and hand from below in the OR and patient understandably has several questions about this.    HISTORY: OB History  Gravida Para Term Preterm AB Living  2 1 1  0 0 1  SAB TAB Ectopic Multiple Live Births  0 0 0 0 1    # Outcome Date GA Lbr Len/2nd Weight Sex Delivery Anes PTL Lv  2 Current           1 Term 07/19/09    F    LIV    Last pap smear was done 01/2019 and was normal  Past Medical History:  Diagnosis Date  . Vaginal Pap smear, abnormal    cryotherapy   Past Surgical History:  Procedure Laterality Date  . GYNECOLOGIC CRYOSURGERY     Family History  Problem Relation Age of Onset  . Cancer Other        LUNG  . Healthy Mother   . Healthy Father   . Diabetes Maternal Grandmother   . Drug abuse Maternal Grandfather   . Hypertension Neg Hx   . Stroke Neg Hx    Social History   Tobacco Use  . Smoking status: Never Smoker  . Smokeless tobacco: Never Used  Substance Use Topics  . Alcohol use: Yes    Comment: social  . Drug use: No   No Known Allergies Current Outpatient Medications on File Prior to Visit  Medication Sig Dispense Refill  . Prenatal Vit-Fe Fumarate-FA (MULTIVITAMIN-PRENATAL) 27-0.8 MG TABS tablet Take 1 tablet by mouth daily at 12 noon.     No current facility-administered medications on file prior to visit.    Review of Systems Pertinent items noted in HPI and remainder of  comprehensive ROS otherwise negative. Physical Exam:   Vitals:   07/06/19 0815  BP: 118/74  Pulse: 70  Weight: 141 lb (64 kg)   Fetal Heart Rate (bpm): 164 Uterus:     Pelvic Exam: Perineum: no hemorrhoids, normal perineum   Vulva: normal external genitalia, no lesions   Vagina:  normal mucosa, normal discharge   Cervix: no lesions and normal, pap smear done.    Adnexa: normal adnexa and no mass, fullness, tenderness   Bony Pelvis: average  System: General: well-developed, well-nourished female in no acute distress   Breasts:  normal appearance, no masses or tenderness bilaterally   Skin: normal coloration and turgor, no rashes   Neurologic: oriented, normal, negative, normal mood   Extremities: normal strength, tone, and muscle mass, ROM of all joints is normal   HEENT PERRLA, extraocular movement intact and sclera clear, anicteric   Mouth/Teeth mucous membranes moist, pharynx normal without lesions and dental hygiene good   Neck supple and no masses   Cardiovascular: regular rate and rhythm   Respiratory:  no respiratory distress, normal breath sounds   Abdomen: soft, non-tender; bowel sounds normal; no masses,  no organomegaly  Bedside Ultrasound for FHR check: Patient informed that  the ultrasound is considered a limited obstetric ultrasound and is not intended to be a complete ultrasound exam.  Patient also informed that the ultrasound is not being completed with the intent of assessing for fetal or placental anomalies or any pelvic abnormalities.  Explained that the purpose of today's ultrasound is to assess for fetal heart rate.  Patient acknowledges the purpose of the exam and the limitations of the study.     Assessment:    Pregnancy: G2P1001 Patient Active Problem List   Diagnosis Date Noted  . Supervision of other normal pregnancy, antepartum 07/06/2019  . Dysplasia of cervix, high grade CIN 2 05/24/2015     Indications for ASA therapy (per uptodate) One of the  following: Previous pregnancy with preeclampsia, especially early onset and with an adverse outcome No Multifetal gestation No Chronic hypertension No Type 1 or 2 diabetes mellitus No Chronic kidney disease No Autoimmune disease (antiphospholipid syndrome, systemic lupus erythematosus) No  Two or more of the following: Nulliparity No Obesity (body mass index >30 kg/m2) No Family history of preeclampsia in mother or sister No Age ?35 years No Sociodemographic characteristics (African American race, low socioeconomic level) No Personal risk factors (eg, previous pregnancy with low birth weight or small for gestational age infant, previous adverse pregnancy outcome [eg, stillbirth], interval >10 years between pregnancies) No  Indications for early 1 hour GTT (per uptodate)  BMI >25 (>23 in Asian women) AND one of the following  Gestational diabetes mellitus in a previous pregnancy No Glycated hemoglobin ?5.7 percent (39 mmol/mol), impaired glucose tolerance, or impaired fasting glucose on previous testing No First-degree relative with diabetes No High-risk race/ethnicity (eg, African American, Latino, Native American, [place] American, Pacific Islander) No History of cardiovascular disease No Hypertension or on therapy for hypertension No High-density lipoprotein cholesterol level <35 mg/dL (5.28 mmol/L) and/or a triglyceride level >250 mg/dL (4.13 mmol/L) No Polycystic ovary syndrome No Physical inactivity No Other clinical condition associated with insulin resistance (eg, severe obesity, acanthosis nigricans) No Previous birth of an infant weighing ?4000 g No Previous stillbirth of unknown cause No    Plan:    1. Supervision of other normal pregnancy, antepartum - Routine care.  - Reassured that team is larger than previous prenatal care provider but much care and energy spent consolidating perspective, formatting practice policies - Obstetric Panel, Including HIV - Culture, OB  Urine - Genetic Screening - GC/Chlamydia probe amp ([place])not at [facility] - Hepatitis C antibody - [place] MFM OB COMP + 14 WK; Future - Babyscripts Schedule Optimization  2. History of abnormal cervical Pap smear - Normal pap 01/2019   Initial labs drawn. Continue prenatal vitamins. Genetic Screening discussed, First trimester screen, Quad screen and NIPS: ordered. Ultrasound discussed; fetal anatomic survey: ordered. Problem list reviewed and updated. The nature of [facility] with multiple MDs and other Advanced Practice Providers was explained to patient; also emphasized that residents, students are part of our team. Routine obstetric precautions reviewed. Return in about 4 weeks (around 08/03/2019) for Virtual.    Clayton Bibles, MSN, CNM Certified Nurse Midwife, [facility], [facility] 07/06/19 10:45 AM

## 2019-07-06 NOTE — Progress Notes (Signed)
DATING AND VIABILITY SONOGRAM   Kathleen Ayala is a 29 y.o. year old G2P1001 with LMP Patient's last menstrual period was 04/27/2019 (exact date). which would correlate to  [redacted]w[redacted]d weeks gestation.  She has regular menstrual cycles.   She is here today for a confirmatory initial sonogram.    GESTATION: SINGLETON yes     FETAL ACTIVITY:          Heart rate        164          The fetus is active.    GESTATIONAL AGE AND  BIOMETRICS:  Gestational criteria: Estimated Date of Delivery: 02/01/20 by LMP now at [redacted]w[redacted]d  Previous Scans:0  GESTATIONAL SAC           4.85cm         10.3 weeks  CROWN RUMP LENGTH           2.76 cm         10.0 weeks                                                   AVERAGE EGA(BY THIS SCAN):  10.0 weeks  WORKING EDD( LMP ):  02/01/2020     TECHNICIAN COMMENTS:  Patient informed that the ultrasound is considered a limited obstetric ultrasound and is not intended to be a complete ultrasound exam. Patient also informed that the ultrasound is not being completed with the intent of assessing for fetal or placental anomalies or any pelvic abnormalities. Explained that the purpose of today's ultrasound is to assess for fetal heart rate. Patient acknowledges the purpose of the exam and the limitations of the study.        A copy of this report including all images has been saved and backed up to a second source for retrieval if needed. All measures and details of the anatomical scan, placentation, fluid volume and pelvic anatomy are contained in that report.  Scheryl Marten 07/06/2019 8:37 AM

## 2019-07-07 LAB — OBSTETRIC PANEL, INCLUDING HIV
Antibody Screen: NEGATIVE
Basophils Absolute: 0 10*3/uL (ref 0.0–0.2)
Basos: 0 %
EOS (ABSOLUTE): 0.1 10*3/uL (ref 0.0–0.4)
Eos: 1 %
HIV Screen 4th Generation wRfx: NONREACTIVE
Hematocrit: 44.4 % (ref 34.0–46.6)
Hemoglobin: 14.3 g/dL (ref 11.1–15.9)
Hepatitis B Surface Ag: NEGATIVE
Immature Grans (Abs): 0 10*3/uL (ref 0.0–0.1)
Immature Granulocytes: 0 %
Lymphocytes Absolute: 1.5 10*3/uL (ref 0.7–3.1)
Lymphs: 21 %
MCH: 28.8 pg (ref 26.6–33.0)
MCHC: 32.2 g/dL (ref 31.5–35.7)
MCV: 89 fL (ref 79–97)
Monocytes Absolute: 0.7 10*3/uL (ref 0.1–0.9)
Monocytes: 9 %
Neutrophils Absolute: 5.1 10*3/uL (ref 1.4–7.0)
Neutrophils: 69 %
Platelets: 302 10*3/uL (ref 150–450)
RBC: 4.97 x10E6/uL (ref 3.77–5.28)
RDW: 11.8 % (ref 11.7–15.4)
RPR Ser Ql: NONREACTIVE
Rh Factor: POSITIVE
Rubella Antibodies, IGG: 5 index (ref >0.99)
WBC: 7.5 10*3/uL (ref 3.4–10.8)

## 2019-07-07 LAB — GC/CHLAMYDIA PROBE AMP (~~LOC~~) NOT AT ARMC
Chlamydia: NEGATIVE
Comment: NEGATIVE
Comment: NORMAL
Neisseria Gonorrhea: NEGATIVE

## 2019-07-07 LAB — HEPATITIS C ANTIBODY: Hep C Virus Ab: <0.1 s/co ratio (ref 0.0–0.9)

## 2019-07-08 LAB — URINE CULTURE, OB REFLEX

## 2019-07-08 LAB — CULTURE, OB URINE

## 2019-07-13 ENCOUNTER — Encounter: Payer: Self-pay | Admitting: Radiology

## 2019-07-13 ENCOUNTER — Telehealth: Payer: Self-pay | Admitting: Radiology

## 2019-07-13 NOTE — Telephone Encounter (Signed)
Patient informed of Panorama results  

## 2019-07-26 ENCOUNTER — Telehealth: Payer: Self-pay | Admitting: *Deleted

## 2019-07-26 ENCOUNTER — Other Ambulatory Visit: Payer: Self-pay

## 2019-07-26 ENCOUNTER — Ambulatory Visit (INDEPENDENT_AMBULATORY_CARE_PROVIDER_SITE_OTHER): Payer: Managed Care, Other (non HMO) | Admitting: *Deleted

## 2019-07-26 VITALS — BP 119/76 | HR 88

## 2019-07-26 DIAGNOSIS — Z3480 Encounter for supervision of other normal pregnancy, unspecified trimester: Secondary | ICD-10-CM

## 2019-07-26 NOTE — Progress Notes (Signed)
Patient seen and assessed by nursing staff.  Agree with documentation and plan.  

## 2019-07-26 NOTE — Telephone Encounter (Signed)
Called pt to follow up on after hours line message that she had some bleeding on Sunday. Pt had moved on Friday and did hade sex as well. Pt states she maybe filled one pad total and is no longer bleeding but having brown discharge. Pt would like to come in for FHR check. Will have pt come today and also discussed to monitor bleeding and to call if she bleeds again.

## 2019-07-26 NOTE — Progress Notes (Signed)
Pt here for a FHR. Pt has no new vaginal bleeding.  FHR 158 via doppler.   Pt to follow up at next ROB or as needed. Will call if any more bleeding.

## 2019-08-01 ENCOUNTER — Other Ambulatory Visit: Payer: Self-pay

## 2019-08-01 ENCOUNTER — Encounter: Payer: Self-pay | Admitting: Family Medicine

## 2019-08-01 ENCOUNTER — Telehealth (INDEPENDENT_AMBULATORY_CARE_PROVIDER_SITE_OTHER): Payer: Managed Care, Other (non HMO) | Admitting: Family Medicine

## 2019-08-01 VITALS — BP 115/75 | HR 76 | Wt 142.0 lb

## 2019-08-01 DIAGNOSIS — Z3A13 13 weeks gestation of pregnancy: Secondary | ICD-10-CM

## 2019-08-01 DIAGNOSIS — Z3480 Encounter for supervision of other normal pregnancy, unspecified trimester: Secondary | ICD-10-CM

## 2019-08-01 DIAGNOSIS — O209 Hemorrhage in early pregnancy, unspecified: Secondary | ICD-10-CM

## 2019-08-01 DIAGNOSIS — Z3481 Encounter for supervision of other normal pregnancy, first trimester: Secondary | ICD-10-CM

## 2019-08-01 NOTE — Progress Notes (Signed)
I connected with  Kathleen Ayala on 08/01/19 at  2:30 PM EDT by telephone and verified that I am speaking with the correct person using two identifiers.   I discussed the limitations, risks, security and privacy concerns of performing an evaluation and management service by telephone and the availability of in person appointments. I also discussed with the patient that there may be a patient responsible charge related to this service. The patient expressed understanding and agreed to proceed.  Scheryl Marten, RN 08/01/2019  2:28 PM   Does not have a cuff yet  occassionally has brown tinged discharge

## 2019-08-01 NOTE — Progress Notes (Signed)
I connected with@ on 08/02/19 at  2:30 PM EDT by: MyChart video and verified that I am speaking with the correct person using two identifiers.  Patient is located at home and provider is located at [facility].     The purpose of this virtual visit is to provide medical care while limiting exposure to the novel coronavirus. I discussed the limitations, risks, security and privacy concerns of performing an evaluation and management service by MyChart video and the availability of in person appointments. I also discussed with the patient that there may be a patient responsible charge related to this service. By engaging in this virtual visit, you consent to the provision of healthcare.  Additionally, you authorize for your insurance to be billed for the services provided during this visit.  The patient expressed understanding and agreed to proceed.  The following staff members participated in the virtual visit: Christine    PRENATAL VISIT NOTE  Subjective:  Brandey Vandalen is a 29 y.o. G2P1001 at [redacted]w[redacted]d  for phone visit for ongoing prenatal care.  She is currently monitored for the following issues for this low-risk pregnancy and has Dysplasia of cervix, high grade CIN 2 and Supervision of other normal pregnancy, antepartum on their problem list.  Patient reports no complaints.  Contractions: Not present. Vag. Bleeding: None.   . Denies leaking of fluid.   Last Sunday bleeding 5/30 from 1830-2300-- BRB, currently has brown spotting. Come to office for FHT and they were 150s which is reassuring.    The following portions of the patient's history were reviewed and updated as appropriate: allergies, current medications, past family history, past medical history, past social history, past surgical history and problem list.   Objective:   Vitals:   08/01/19 1427  BP: 115/75  Pulse: 76  Weight: 142 lb (64.4 kg)   Self-Obtained  Fetal Status: Fetal Heart Rate (bpm): 155         Assessment and  Plan:  Pregnancy: G2P1001 at [redacted]w[redacted]d 1. Supervision of other normal pregnancy, antepartum Counseled that all signs are reassuring Would like to come in for FHR check  She is having light brown spotting but is nervous about this. She was reassured by FHR on 6/1 Recommended patient come for RN visit for BP check and FHT today.  Patient will come to the office.   Preterm labor symptoms and general obstetric precautions including but not limited to vaginal bleeding, contractions, leaking of fluid and fetal movement were reviewed in detail with the patient.  Return in about 4 weeks (around 08/29/2019) for Routine prenatal care.  Future Appointments  Date Time Provider Department Center  09/07/2019  9:00 AM [facility] NURSE [facility]  09/07/2019  9:00 AM [facility] US1 [facility]     Time spent on virtual visit: 10  Minutes on video and then 5 minutes in person for BP and FHT  Federico Flake, MD

## 2019-09-07 ENCOUNTER — Ambulatory Visit: Payer: Managed Care, Other (non HMO) | Admitting: *Deleted

## 2019-09-07 ENCOUNTER — Ambulatory Visit: Payer: Managed Care, Other (non HMO) | Attending: Obstetrics and Gynecology

## 2019-09-07 ENCOUNTER — Other Ambulatory Visit: Payer: Self-pay

## 2019-09-07 DIAGNOSIS — Z3480 Encounter for supervision of other normal pregnancy, unspecified trimester: Secondary | ICD-10-CM

## 2019-09-07 DIAGNOSIS — Z363 Encounter for antenatal screening for malformations: Secondary | ICD-10-CM

## 2019-09-07 DIAGNOSIS — Z3A19 19 weeks gestation of pregnancy: Secondary | ICD-10-CM | POA: Diagnosis not present

## 2019-09-08 ENCOUNTER — Other Ambulatory Visit: Payer: Self-pay | Admitting: *Deleted

## 2019-09-08 DIAGNOSIS — Z362 Encounter for other antenatal screening follow-up: Secondary | ICD-10-CM

## 2019-09-19 ENCOUNTER — Other Ambulatory Visit (HOSPITAL_COMMUNITY)
Admission: RE | Admit: 2019-09-19 | Discharge: 2019-09-19 | Disposition: A | Discharge status: Home / Self Care | Payer: Managed Care, Other (non HMO) | Source: Ambulatory Visit | Attending: Obstetrics & Gynecology | Admitting: Obstetrics & Gynecology

## 2019-09-19 ENCOUNTER — Ambulatory Visit (INDEPENDENT_AMBULATORY_CARE_PROVIDER_SITE_OTHER): Payer: Managed Care, Other (non HMO) | Admitting: Obstetrics & Gynecology

## 2019-09-19 ENCOUNTER — Other Ambulatory Visit: Payer: Self-pay

## 2019-09-19 VITALS — BP 114/75 | HR 72 | Wt 148.0 lb

## 2019-09-19 DIAGNOSIS — N760 Acute vaginitis: Secondary | ICD-10-CM | POA: Diagnosis present

## 2019-09-19 DIAGNOSIS — B373 Candidiasis of vulva and vagina: Secondary | ICD-10-CM

## 2019-09-19 DIAGNOSIS — S6991XA Unspecified injury of right wrist, hand and finger(s), initial encounter: Secondary | ICD-10-CM

## 2019-09-19 DIAGNOSIS — O98812 Other maternal infectious and parasitic diseases complicating pregnancy, second trimester: Secondary | ICD-10-CM

## 2019-09-19 DIAGNOSIS — B3731 Acute candidiasis of vulva and vagina: Secondary | ICD-10-CM

## 2019-09-19 DIAGNOSIS — O9A212 Injury, poisoning and certain other consequences of external causes complicating pregnancy, second trimester: Secondary | ICD-10-CM

## 2019-09-19 DIAGNOSIS — Z3480 Encounter for supervision of other normal pregnancy, unspecified trimester: Secondary | ICD-10-CM

## 2019-09-19 DIAGNOSIS — O23593 Infection of other part of genital tract in pregnancy, third trimester: Secondary | ICD-10-CM | POA: Insufficient documentation

## 2019-09-19 DIAGNOSIS — Z3A20 20 weeks gestation of pregnancy: Secondary | ICD-10-CM

## 2019-09-19 NOTE — Patient Instructions (Signed)

## 2019-09-19 NOTE — Progress Notes (Addendum)
   PRENATAL VISIT NOTE  Subjective:  Kathleen Ayala is a 29 y.o. G2P1001 at [redacted]w[redacted]d being seen today for ongoing prenatal care.  She is currently monitored for the following issues for this low-risk pregnancy and has Dysplasia of cervix, high grade CIN 2 and Supervision of other normal pregnancy, antepartum on their problem list.  Patient reports having an accident at home yesterday and hurting her right index finger. Currently has it immobilized with a ice pack. Reports significant pain. Also reports having itchy vaginal discharge.  Contractions: Not present.  .  Movement: Present. Denies leaking of fluid.   The following portions of the patient's history were reviewed and updated as appropriate: allergies, current medications, past family history, past medical history, past social history, past surgical history and problem list.   Objective:   Vitals:   09/19/19 1452  BP: 114/75  Pulse: 72  Weight: 148 lb (67.1 kg)    Fetal Status: Fetal Heart Rate (bpm): 147   Movement: Present     General:  Alert, oriented and cooperative. Patient is in no acute distress.  Skin: Skin is warm and dry. No rash noted.   Cardiovascular: Normal heart rate noted  Respiratory: Normal respiratory effort, no problems with respiration noted  Abdomen: Soft, gravid, appropriate for gestational age.  Pain/Pressure: Absent     Pelvic: Cervical exam deferred        Extremities: Normal range of motion. R index finger is red and swollen.     Mental Status: Normal mood and affect. Normal behavior. Normal judgment and thought content.   Assessment and Plan:  Pregnancy: G2P1001 at [redacted]w[redacted]d 1. Vaginitis during pregnancy in second trimester Self-swab done, will follow up results and manage accordingly. - Cervicovaginal ancillary only( [place])  2. Injury of right index finger, initial encounter Advised her to go to Urgent Care to be evaluated and to get X-ray, will defer to their management.   3. [redacted] weeks  gestation of pregnancy 4. Supervision of other normal pregnancy, antepartum Low risk female on NIPS. Inadequate anatomy scan, follow up scheduled. Counseled about MSAFP test, she will consider this. She was told that the latest she will can get this is [redacted]w[redacted]d. If she decides to do this, she will call for lab appointment. Patient did not have good experience with last 2 hr GTT, felt very sick and lightheaded and did not like the fasting state. Wants to do the 1 hr GTT at 28 weeks (not 2 hr GTT). Understands that if abnormal, she will need fasting 2 or 3 hr GTT. Preterm labor symptoms and general obstetric precautions including but not limited to vaginal bleeding, contractions, leaking of fluid and fetal movement were reviewed in detail with the patient. Please refer to After Visit Summary for other counseling recommendations.   Return in about 4 weeks (around 10/17/2019) for OFFICE OB Visit.  Future Appointments  Date Time Provider Department Center  10/06/2019  7:45 AM [facility] US4 [facility] [place]  10/19/2019  2:45 PM Federico Flake, MD [facility]    Jaynie Collins, MD

## 2019-09-21 LAB — CERVICOVAGINAL ANCILLARY ONLY
Bacterial Vaginitis (gardnerella): NEGATIVE
Candida Glabrata: NEGATIVE
Candida Vaginitis: POSITIVE — AB
Chlamydia: NEGATIVE
Comment: NEGATIVE
Comment: NEGATIVE
Comment: NEGATIVE
Comment: NEGATIVE
Comment: NEGATIVE
Comment: NORMAL
Neisseria Gonorrhea: NEGATIVE
Trichomonas: NEGATIVE

## 2019-09-21 MED ORDER — TERCONAZOLE 0.8 % VA CREA: 1.0000 | TOPICAL_CREAM | Freq: Every day | VAGINAL | 0 refills | Status: DC

## 2019-09-21 NOTE — Addendum Note (Signed)
Addended by: Jaynie Collins A on: 09/21/2019 04:40 PM   Modules accepted: Orders

## 2019-10-06 ENCOUNTER — Ambulatory Visit: Payer: Managed Care, Other (non HMO) | Attending: Obstetrics and Gynecology

## 2019-10-06 ENCOUNTER — Other Ambulatory Visit: Payer: Self-pay

## 2019-10-06 DIAGNOSIS — Z3A23 23 weeks gestation of pregnancy: Secondary | ICD-10-CM

## 2019-10-06 DIAGNOSIS — Z362 Encounter for other antenatal screening follow-up: Secondary | ICD-10-CM | POA: Diagnosis present

## 2019-10-19 ENCOUNTER — Other Ambulatory Visit: Payer: Self-pay

## 2019-10-19 ENCOUNTER — Encounter: Payer: Managed Care, Other (non HMO) | Admitting: Obstetrics and Gynecology

## 2019-10-19 ENCOUNTER — Telehealth (INDEPENDENT_AMBULATORY_CARE_PROVIDER_SITE_OTHER): Payer: Managed Care, Other (non HMO) | Admitting: Obstetrics and Gynecology

## 2019-10-19 DIAGNOSIS — Z3480 Encounter for supervision of other normal pregnancy, unspecified trimester: Secondary | ICD-10-CM

## 2019-10-19 DIAGNOSIS — Z3A25 25 weeks gestation of pregnancy: Secondary | ICD-10-CM

## 2019-10-19 DIAGNOSIS — Z3482 Encounter for supervision of other normal pregnancy, second trimester: Secondary | ICD-10-CM

## 2019-10-19 NOTE — Progress Notes (Signed)
    TELEHEALTH VIRTUAL OBSTETRICS VISIT ENCOUNTER NOTE  Clinic: [facility]  I connected with Kathleen Ayala on 10/19/19 at  2:45 PM EDT by telephone at home and verified that I am speaking with the correct person using two identifiers.   I discussed the limitations, risks, security and privacy concerns of performing an evaluation and management service by telephone and the availability of in person appointments. I also discussed with the patient that there may be a patient responsible charge related to this service. The patient expressed understanding and agreed to proceed.  Subjective:  Kathleen Ayala is a 29 y.o. G2P1001 at 108w0d being followed for ongoing prenatal care.  She is currently monitored for the following issues for this low-risk pregnancy and has Dysplasia of cervix, high grade CIN 2 and Supervision of other normal pregnancy, antepartum on their problem list.  Patient reports no complaints. Reports fetal movement. Denies any contractions, bleeding or leaking of fluid.   The following portions of the patient's history were reviewed and updated as appropriate: allergies, current medications, past family history, past medical history, past social history, past surgical history and problem list.   Objective:  There were no vitals filed for this visit.  Babyscripts Data Reviewed: not applicable  General:  Alert, oriented and cooperative.   Mental Status: Normal mood and affect perceived. Normal judgment and thought content.  Rest of physical exam deferred due to type of encounter  Assessment and Plan:  Pregnancy: G2P1001 at [redacted]w[redacted]d 1. Supervision of other normal pregnancy, antepartum Routine care. Just moved. Pt to let us know weight and bp. Prefers 1h. nv  Preterm labor symptoms and general obstetric precautions including but not limited to vaginal bleeding, contractions, leaking of fluid and fetal movement were reviewed in detail with the  patient.  I discussed the assessment and treatment plan with the patient. The patient was provided an opportunity to ask questions and all were answered. The patient agreed with the plan and demonstrated an understanding of the instructions. The patient was advised to call back or seek an in-person office evaluation/go to [facility] at [facility] for any urgent or concerning symptoms. Please refer to After Visit Summary for other counseling recommendations.   I provided 7 minutes of non-face-to-face time during this encounter. The visit was conducted via MyChart-medicine  Return in about 2 weeks (around 11/02/2019) for low risk, 2hr GTT, in person.  Future Appointments  Date Time Provider Department Center  11/02/2019  9:20 AM Crisoforo Oxford, Charlesetta Garibaldi, CNM [facility]    [place] Bing, MD [facility] for [facility], [facility]

## 2019-11-02 ENCOUNTER — Ambulatory Visit (INDEPENDENT_AMBULATORY_CARE_PROVIDER_SITE_OTHER): Payer: Managed Care, Other (non HMO) | Admitting: Student

## 2019-11-02 ENCOUNTER — Other Ambulatory Visit: Payer: Self-pay

## 2019-11-02 VITALS — BP 123/78 | HR 96 | Wt 157.0 lb

## 2019-11-02 DIAGNOSIS — Z3A27 27 weeks gestation of pregnancy: Secondary | ICD-10-CM

## 2019-11-02 DIAGNOSIS — Z3480 Encounter for supervision of other normal pregnancy, unspecified trimester: Secondary | ICD-10-CM

## 2019-11-02 NOTE — Progress Notes (Signed)
   PRENATAL VISIT NOTE  Subjective:  Kathleen Ayala is a 29 y.o. G2P1001 at [redacted]w[redacted]d being seen today for ongoing prenatal care.  She is currently monitored for the following issues for this low-risk pregnancy and has Dysplasia of cervix, high grade CIN 2 and Supervision of other normal pregnancy, antepartum on their problem list.  Patient reports no complaints.  Contractions: Irritability. Vag. Bleeding: None.  Movement: Present. Denies leaking of fluid.   The following portions of the patient's history were reviewed and updated as appropriate: allergies, current medications, past family history, past medical history, past social history, past surgical history and problem list.   Objective:   Vitals:   11/02/19 0927  BP: 123/78  Pulse: 96  Weight: 157 lb (71.2 kg)    Fetal Status: Fetal Heart Rate (bpm): 157 Fundal Height: 28 cm Movement: Present     General:  Alert, oriented and cooperative. Patient is in no acute distress.  Skin: Skin is warm and dry. No rash noted.   Cardiovascular: Normal heart rate noted  Respiratory: Normal respiratory effort, no problems with respiration noted  Abdomen: Soft, gravid, appropriate for gestational age.  Pain/Pressure: Absent     Pelvic: Cervical exam deferred        Extremities: Normal range of motion.  Edema: Trace  Mental Status: Normal mood and affect. Normal behavior. Normal judgment and thought content.   Assessment and Plan:  Pregnancy: G2P1001 at [redacted]w[redacted]d 1. Supervision of other normal pregnancy, antepartum -Discussed signs and symptoms of labor; reviewed 5-1-1 rule. Answered questions about pain relief options in labor; patient wants early epidural if possible. Patient understands that she may have to do 3 hour test if she fails one hour glucola.  - Glucose tolerance, 1 hour - CBC - RPR - HIV Antibody (routine testing w rflx)  2. [redacted] weeks gestation of pregnancy - Glucose tolerance, 1 hour - CBC - RPR - HIV Antibody (routine  testing w rflx)  Preterm labor symptoms and general obstetric precautions including but not limited to vaginal bleeding, contractions, leaking of fluid and fetal movement were reviewed in detail with the patient. Please refer to After Visit Summary for other counseling recommendations.   Return in about 4 weeks (around 11/30/2019), or LROB.  Future Appointments  Date Time Provider Department Center  11/30/2019  8:25 AM Crisoforo Oxford, Charlesetta Garibaldi, CNM [facility]    Marylene Land, [place]

## 2019-11-02 NOTE — Patient Instructions (Signed)

## 2019-11-03 LAB — CBC
Hematocrit: 36.3 % (ref 34.0–46.6)
Hemoglobin: 12.2 g/dL (ref 11.1–15.9)
MCH: 30.6 pg (ref 26.6–33.0)
MCHC: 33.6 g/dL (ref 31.5–35.7)
MCV: 91 fL (ref 79–97)
Platelets: 249 10*3/uL (ref 150–450)
RBC: 3.99 x10E6/uL (ref 3.77–5.28)
RDW: 12.6 % (ref 11.7–15.4)
WBC: 8.5 10*3/uL (ref 3.4–10.8)

## 2019-11-03 LAB — HIV ANTIBODY (ROUTINE TESTING W REFLEX): HIV Screen 4th Generation wRfx: NONREACTIVE

## 2019-11-03 LAB — RPR: RPR Ser Ql: NONREACTIVE

## 2019-11-03 LAB — GLUCOSE TOLERANCE, 1 HOUR: Glucose, 1Hr PP: 119 mg/dL (ref 65–199)

## 2019-11-16 ENCOUNTER — Encounter: Payer: Managed Care, Other (non HMO) | Admitting: Student

## 2019-11-30 ENCOUNTER — Ambulatory Visit (INDEPENDENT_AMBULATORY_CARE_PROVIDER_SITE_OTHER): Payer: Managed Care, Other (non HMO) | Admitting: Student

## 2019-11-30 ENCOUNTER — Other Ambulatory Visit: Payer: Self-pay

## 2019-11-30 VITALS — BP 114/75 | HR 92 | Wt 161.0 lb

## 2019-11-30 DIAGNOSIS — Z3A31 31 weeks gestation of pregnancy: Secondary | ICD-10-CM

## 2019-11-30 DIAGNOSIS — Z3480 Encounter for supervision of other normal pregnancy, unspecified trimester: Secondary | ICD-10-CM

## 2019-11-30 DIAGNOSIS — Z23 Encounter for immunization: Secondary | ICD-10-CM | POA: Diagnosis not present

## 2019-11-30 NOTE — Progress Notes (Signed)
Patient ID: Kathleen Ayala, female   DOB: 18-Feb-1991, 29 y.o.   MRN: [id number]   PRENATAL VISIT NOTE  Subjective:  Kathleen Ayala is a 29 y.o. G2P1001 at [redacted]w[redacted]d being seen today for ongoing prenatal care.  She is currently monitored for the following issues for this low-risk pregnancy and has Dysplasia of cervix, high grade CIN 2 and Supervision of other normal pregnancy, antepartum on their problem list.  Patient reports constipation. Has been forgetting her colace, took milk of magnesia. Also has concerns about TDAP with covid 19 vaccine. .  Contractions: Irritability. Vag. Bleeding: None.  Movement: Present. Denies leaking of fluid.   The following portions of the patient's history were reviewed and updated as appropriate: allergies, current medications, past family history, past medical history, past social history, past surgical history and problem list.   Objective:   Vitals:   11/30/19 0831  BP: 114/75  Pulse: 92  Weight: 161 lb (73 kg)    Fetal Status: Fetal Heart Rate (bpm): 152   Movement: Present     General:  Alert, oriented and cooperative. Patient is in no acute distress.  Skin: Skin is warm and dry. No rash noted.   Cardiovascular: Normal heart rate noted  Respiratory: Normal respiratory effort, no problems with respiration noted  Abdomen: Soft, gravid, appropriate for gestational age.  Pain/Pressure: Present     Pelvic: Cervical exam deferred        Extremities: Normal range of motion.  Edema: Trace  Mental Status: Normal mood and affect. Normal behavior. Normal judgment and thought content.   Assessment and Plan:  Pregnancy: G2P1001 at [redacted]w[redacted]d 1. Supervision of other normal pregnancy, antepartum -Discussed Tdap; information given. Information given on importance of Tdap in pregnancy and for adults around baby to get vaccinated. Patient agrees to Tdap today.  -Discussed constipation measures, increase in fiber, roughage.   2. [redacted] weeks gestation of  pregnancy -Continue routine care  Preterm labor symptoms and general obstetric precautions including but not limited to vaginal bleeding, contractions, leaking of fluid and fetal movement were reviewed in detail with the patient. Please refer to After Visit Summary for other counseling recommendations.   Return in about 2 weeks (around 12/14/2019), or LROB in person.  No future appointments.  Marylene Land, CNM

## 2019-11-30 NOTE — Patient Instructions (Signed)

## 2019-12-14 ENCOUNTER — Ambulatory Visit (INDEPENDENT_AMBULATORY_CARE_PROVIDER_SITE_OTHER): Payer: Managed Care, Other (non HMO) | Admitting: Student

## 2019-12-14 ENCOUNTER — Other Ambulatory Visit (HOSPITAL_COMMUNITY)
Admission: RE | Admit: 2019-12-14 | Discharge: 2019-12-14 | Disposition: A | Discharge status: Home / Self Care | Payer: Managed Care, Other (non HMO) | Source: Ambulatory Visit | Attending: Student | Admitting: Student

## 2019-12-14 ENCOUNTER — Other Ambulatory Visit: Payer: Self-pay

## 2019-12-14 VITALS — BP 125/75 | HR 107 | Wt 165.0 lb

## 2019-12-14 DIAGNOSIS — O23592 Infection of other part of genital tract in pregnancy, second trimester: Secondary | ICD-10-CM

## 2019-12-14 DIAGNOSIS — N760 Acute vaginitis: Secondary | ICD-10-CM

## 2019-12-14 DIAGNOSIS — B373 Candidiasis of vulva and vagina: Secondary | ICD-10-CM

## 2019-12-14 DIAGNOSIS — N898 Other specified noninflammatory disorders of vagina: Secondary | ICD-10-CM | POA: Insufficient documentation

## 2019-12-14 DIAGNOSIS — Z3A33 33 weeks gestation of pregnancy: Secondary | ICD-10-CM

## 2019-12-14 DIAGNOSIS — Z3480 Encounter for supervision of other normal pregnancy, unspecified trimester: Secondary | ICD-10-CM

## 2019-12-14 DIAGNOSIS — B3731 Acute candidiasis of vulva and vagina: Secondary | ICD-10-CM

## 2019-12-14 MED ORDER — POLYETHYLENE GLYCOL 3350 17 G PO PACK: 17.0000 g | PACK | Freq: Every day | ORAL | 2 refills | Status: DC

## 2019-12-14 NOTE — Progress Notes (Signed)
   PRENATAL VISIT NOTE  Subjective:  Kathleen Ayala is a 29 y.o. G2P1001 at [redacted]w[redacted]d being seen today for ongoing prenatal care.  She is currently monitored for the following issues for this low-risk pregnancy and has Dysplasia of cervix, high grade CIN 2 and Supervision of other normal pregnancy, antepartum on their problem list.  Patient reports constipation. She reports eating pasta last night and having a stomach ache afterwards. .  She reports that she has had increased moisture and that she notices some itching. She wants to be tested for yeast and BV.   Contractions: Irritability. Vag. Bleeding: None.  Movement: Present. Denies leaking of fluid.   The following portions of the patient's history were reviewed and updated as appropriate: allergies, current medications, past family history, past medical history, past social history, past surgical history and problem list.   Objective:   Vitals:   12/14/19 0900  BP: 125/75  Pulse: (!) 107  Weight: 165 lb (74.8 kg)    Fetal Status: Fetal Heart Rate (bpm): 153 Fundal Height: 35 cm Movement: Present     General:  Alert, oriented and cooperative. Patient is in no acute distress.  Skin: Skin is warm and dry. No rash noted.   Cardiovascular: Normal heart rate noted  Respiratory: Normal respiratory effort, no problems with respiration noted  Abdomen: Soft, gravid, appropriate for gestational age.  Pain/Pressure: Present     Pelvic: Cervical exam deferred        Extremities: Normal range of motion.  Edema: Trace  Mental Status: Normal mood and affect. Normal behavior. Normal judgment and thought content.   Assessment and Plan:  Pregnancy: G2P1001 at [redacted]w[redacted]d 1. Vaginal discharge -Discussed constipation prevention measures -Discussed that patient might need work note to be out for 8 weeks PP; I agreed to provide work note PP if she needs it.  - Cervicovaginal ancillary only  Preterm labor symptoms and general obstetric precautions  including but not limited to vaginal bleeding, contractions, leaking of fluid and fetal movement were reviewed in detail with the patient. Please refer to After Visit Summary for other counseling recommendations.   Return in about 2 weeks (around 12/28/2019), or LROB.  Future Appointments  Date Time Provider Department Center  12/28/2019  1:15 PM Federico Flake, MD [facility]  01/04/2020  8:10 AM Calvert Cantor, CNM [facility]  01/11/2020  8:30 AM Federico Flake, MD [facility]  01/18/2020  8:10 AM Calvert Cantor, CNM [facility]    Marylene Land, CNM

## 2019-12-14 NOTE — Patient Instructions (Signed)

## 2019-12-15 LAB — CERVICOVAGINAL ANCILLARY ONLY
Bacterial Vaginitis (gardnerella): NEGATIVE
Candida Glabrata: NEGATIVE
Candida Vaginitis: POSITIVE — AB
Comment: NEGATIVE
Comment: NEGATIVE
Comment: NEGATIVE

## 2019-12-16 ENCOUNTER — Telehealth: Payer: Self-pay

## 2019-12-16 MED ORDER — TERCONAZOLE 0.4 % VA CREA: 1.0000 | TOPICAL_CREAM | Freq: Every day | VAGINAL | 0 refills | Status: DC

## 2019-12-16 NOTE — Telephone Encounter (Signed)
Called pt to inform her medication was sent to pharmacy for positive yeast infection. Pt voiced understanding.

## 2019-12-16 NOTE — Addendum Note (Signed)
Addended by: Chrystie Nose on: 12/16/2019 09:40 AM   Modules accepted: Orders

## 2019-12-28 ENCOUNTER — Ambulatory Visit (INDEPENDENT_AMBULATORY_CARE_PROVIDER_SITE_OTHER): Payer: Managed Care, Other (non HMO) | Admitting: Family Medicine

## 2019-12-28 ENCOUNTER — Other Ambulatory Visit: Payer: Self-pay

## 2019-12-28 ENCOUNTER — Encounter: Payer: Self-pay | Admitting: Family Medicine

## 2019-12-28 VITALS — BP 110/74 | HR 99 | Wt 166.6 lb

## 2019-12-28 DIAGNOSIS — N871 Moderate cervical dysplasia: Secondary | ICD-10-CM

## 2019-12-28 DIAGNOSIS — Z3480 Encounter for supervision of other normal pregnancy, unspecified trimester: Secondary | ICD-10-CM

## 2019-12-28 NOTE — Progress Notes (Signed)
   PRENATAL VISIT NOTE  Subjective:  Kathleen Ayala is a 29 y.o. G2P1001 at [redacted]w[redacted]d being seen today for ongoing prenatal care.  She is currently monitored for the following issues for this low-risk pregnancy and has Dysplasia of cervix, high grade CIN 2 and Supervision of other normal pregnancy, antepartum on their problem list.  Patient reports no complaints.  Contractions: Irritability. Vag. Bleeding: None.  Movement: Present. Denies leaking of fluid.   The following portions of the patient's history were reviewed and updated as appropriate: allergies, current medications, past family history, past medical history, past social history, past surgical history and problem list.   Objective:   Vitals:   12/28/19 1323  BP: 110/74  Pulse: 99  Weight: 166 lb 9.6 oz (75.6 kg)    Fetal Status: Fetal Heart Rate (bpm): 150   Movement: Present     General:  Alert, oriented and cooperative. Patient is in no acute distress.  Skin: Skin is warm and dry. No rash noted.   Cardiovascular: Normal heart rate noted  Respiratory: Normal respiratory effort, no problems with respiration noted  Abdomen: Soft, gravid, appropriate for gestational age.  Pain/Pressure: Present     Pelvic: Cervical exam deferred        Extremities: Normal range of motion.  Edema: Trace  Mental Status: Normal mood and affect. Normal behavior. Normal judgment and thought content.   Assessment and Plan:  Pregnancy: G2P1001 at [redacted]w[redacted]d 1. Supervision of other normal pregnancy, antepartum multiple questions about breastfeeding, infant sleeping, labor etc- answered all questions Reviewed reasons to seek care Reviewed plans for feeding and contraception  2. Dysplasia of cervix, high grade CIN 2 S/p cryo Follow up has ben NIL  Preterm labor symptoms and general obstetric precautions including but not limited to vaginal bleeding, contractions, leaking of fluid and fetal movement were reviewed in detail with the patient. Please  refer to After Visit Summary for other counseling recommendations.   Return in about 1 week (around 01/04/2020) for Routine prenatal care, MD or APP, 36wks.  Future Appointments  Date Time Provider Department Center  01/04/2020  8:10 AM Calvert Cantor, CNM [facility]  01/11/2020  8:30 AM Federico Flake, MD [facility]  01/18/2020  8:10 AM Calvert Cantor, CNM [facility]    Federico Flake, MD

## 2020-01-04 ENCOUNTER — Ambulatory Visit (INDEPENDENT_AMBULATORY_CARE_PROVIDER_SITE_OTHER): Payer: Managed Care, Other (non HMO) | Admitting: Advanced Practice Midwife

## 2020-01-04 ENCOUNTER — Other Ambulatory Visit (HOSPITAL_COMMUNITY)
Admission: RE | Admit: 2020-01-04 | Discharge: 2020-01-04 | Disposition: A | Discharge status: Home / Self Care | Payer: Managed Care, Other (non HMO) | Source: Ambulatory Visit | Attending: Advanced Practice Midwife | Admitting: Advanced Practice Midwife

## 2020-01-04 ENCOUNTER — Other Ambulatory Visit: Payer: Self-pay

## 2020-01-04 VITALS — BP 119/78 | HR 97 | Wt 169.8 lb

## 2020-01-04 DIAGNOSIS — Z3480 Encounter for supervision of other normal pregnancy, unspecified trimester: Secondary | ICD-10-CM | POA: Diagnosis not present

## 2020-01-04 DIAGNOSIS — Z3A36 36 weeks gestation of pregnancy: Secondary | ICD-10-CM

## 2020-01-04 MED ORDER — DOCUSATE SODIUM 100 MG PO CAPS: 100.0000 mg | ORAL_CAPSULE | Freq: Two times a day (BID) | ORAL | 2 refills | Status: DC | PRN

## 2020-01-04 NOTE — Progress Notes (Signed)
   PRENATAL VISIT NOTE  Subjective:  Kathleen Ayala is a 29 y.o. G2P1001 at [redacted]w[redacted]d being seen today for ongoing prenatal care.  She is currently monitored for the following issues for this low-risk pregnancy and has Dysplasia of cervix, high grade CIN 2 and Supervision of other normal pregnancy, antepartum on their problem list.  Patient reports no complaints.  Contractions: Irritability. Vag. Bleeding: None.  Movement: Present. Denies leaking of fluid.   The following portions of the patient's history were reviewed and updated as appropriate: allergies, current medications, past family history, past medical history, past social history, past surgical history and problem list. Problem list updated.  Objective:   Vitals:   01/04/20 0824  BP: 119/78  Pulse: 97  Weight: 169 lb 12.8 oz (77 kg)    Fetal Status: Fetal Heart Rate (bpm): 158 Fundal Height: 36 cm Movement: Present  Presentation: Vertex  General:  Alert, oriented and cooperative. Patient is in no acute distress.  Skin: Skin is warm and dry. No rash noted.   Cardiovascular: Normal heart rate noted  Respiratory: Normal respiratory effort, no problems with respiration noted  Abdomen: Soft, gravid, appropriate for gestational age.  Pain/Pressure: Present     Pelvic: Cervical exam deferred per pt preference        Extremities: Normal range of motion.  Edema: Trace  Mental Status: Normal mood and affect. Normal behavior. Normal judgment and thought content.   Assessment and Plan:  Pregnancy: G2P1001 at [redacted]w[redacted]d  1. Supervision of other normal pregnancy, antepartum - LOB - Reviewed indications for MAU evaluation, typical labor triage - Considering IUD vs partner vasectomy. Continue discussion next visits - Strep Gp B NAA - Reviewed impact of GBS + on labor course - GC/Chlamydia probe amp ([place])not at [facility]   Preterm labor symptoms and general obstetric precautions including but not limited to vaginal bleeding,  contractions, leaking of fluid and fetal movement were reviewed in detail with the patient. Please refer to After Visit Summary for other counseling recommendations.  No follow-ups on file.  Future Appointments  Date Time Provider Department Center  01/11/2020  8:30 AM Federico Flake, MD [facility]  01/18/2020  8:10 AM Calvert Cantor, CNM [facility]    Calvert Cantor, CNM

## 2020-01-04 NOTE — Patient Instructions (Signed)
First Stage of Labor °Labor is your body's natural process of moving your baby and other structures, including the placenta and umbilical cord, out of your uterus. There are three stages of labor. How long each stage lasts is different for every woman. But certain events happen during each stage that are the same for everyone. °· The first stage starts when true labor begins. This stage ends when your cervix, which is the opening from your uterus into your vagina, is completely open (dilated). °· The second stage begins when your cervix is fully dilated and you start pushing. This stage ends when your baby is born. °· The third stage is the delivery of the organ that nourished your baby during pregnancy (placenta). °First stage of labor °As your due date gets closer, you may start to notice certain physical changes that mean labor is going to start soon. You may feel that your baby has dropped lower into your pelvis. You may experience irregular, often painless, contractions that go away when you walk around or lie down (Braxton Hicks contractions). This is also called false labor. °The first stage of labor begins when you start having contractions that come at regular (evenly spaced) intervals and your cervix starts to get thinner and wider in preparation for your baby to pass through. Birth care providers measure the dilation of your cervix in centimeters (cm). One centimeter is a little less than one-half of an inch. The first stage ends when your cervix is dilated to 10 cm. The first stage of labor is divided into three phases: °· Early phase. °· Active phase. °· Transitional phase. °The length of the first stage of labor varies. It may be longer if this is your first pregnancy. You may spend most of this stage at home trying to relax and stay comfortable. °How does this affect me? °During the first stage of labor, you will move through three phases. °What happens in the early phase? °· You will start to have  regular contractions that last 30-60 seconds. Contractions may come every 5-20 minutes. Keep track of your contractions and call your birth care provider. °· Your water may break during this phase. °· You may notice a clear or slightly bloody discharge of mucus (mucus plug) from your vagina. °· Your cervix will dilate to 3-6 cm. °What happens in the active phase? °The active phase usually lasts 3-5 hours. You may go to the hospital or birth center around this time. During the active phase: °· Your contractions will become stronger, longer, and more uncomfortable. °· Your contractions may last 45-90 seconds and come every 3-5 minutes. °· You may feel lower back pain. °· Your birth care providers may examine your cervix and feel your belly to find the position of your baby. °· You may have a monitor strapped to your belly to measure your contractions and your baby's heart rate. °· You may start using your pain management options. °· Your cervix may be dilated to 6 cm and may start to dilate more quickly. °What happens in the transitional phase? °The transitional phase typically lasts from 30 minutes to 2 hours. At the end of this phase, your cervix will be fully dilated to 10 cm. During the transitional phase: °· Contractions will get stronger and longer. °· Contractions may last 60-90 seconds and come less than 2 minutes apart. °· You may feel hot flashes, chills, or nausea. °How does this affect my baby? °During the first stage of labor, your baby will   gradually move down into your birth canal. °Follow these instructions at home and in the hospital or birth center: ° °· When labor first begins, try to stay calm. You are still in the early phase. If it is night, try to get some sleep. If it is day, try to relax and save your energy. You may want to make some calls and get ready to go to the hospital or birth center. °· When you are in the early phase, try these methods to help ease discomfort: °? Deep breathing and  muscle relaxation. °? Taking a walk. °? Taking a warm bath or shower. °· Drink some fluids and have a light snack if you feel like it. °· Keep track of your contractions. °· Based on the plan you created with your birth care provider, call when your contractions indicate it is time. °· If your water breaks, note the time, color, and odor of the fluid. °· When you are in the active phase, do your breathing exercises and rely on your support people and your team of birth care providers. °Contact a health care provider if: °· Your contractions are strong and regular. °· You have lower back pain or cramping. °· Your water breaks. °· You lose your mucus plug. °Get help right away if you: °· Have a severe headache that does not go away. °· Have changes in your vision. °· Have severe pain in your upper belly. °· Do not feel the baby move. °· Have bright red bleeding. °Summary °· The first stage of labor starts when true labor begins, and it ends when your cervix is dilated to 10 cm. °· The first stage of labor has three phases: early, active, and transitional. °· Your baby moves into the birth canal during the first stage of labor. °· You may have contractions that become stronger and longer. You may also lose your mucus plug and have your water break. °· Call your birth care provider when your contractions are frequent and strong enough to go to the hospital or birth center. °This information is not intended to replace advice given to you by your health care provider. Make sure you discuss any questions you have with your health care provider. °Document Revised: 06/03/2018 Document Reviewed: 04/26/2017 °Elsevier Patient Education © 2020 [employer] ° °

## 2020-01-05 LAB — GC/CHLAMYDIA PROBE AMP (~~LOC~~) NOT AT ARMC
Chlamydia: NEGATIVE
Comment: NEGATIVE
Comment: NORMAL
Neisseria Gonorrhea: NEGATIVE

## 2020-01-06 LAB — STREP GP B NAA: Strep Gp B NAA: NEGATIVE

## 2020-01-11 ENCOUNTER — Other Ambulatory Visit: Payer: Self-pay

## 2020-01-11 ENCOUNTER — Ambulatory Visit (INDEPENDENT_AMBULATORY_CARE_PROVIDER_SITE_OTHER): Payer: Managed Care, Other (non HMO) | Admitting: Family Medicine

## 2020-01-11 ENCOUNTER — Encounter: Payer: Self-pay | Admitting: Family Medicine

## 2020-01-11 VITALS — BP 119/79 | HR 101 | Wt 172.0 lb

## 2020-01-11 DIAGNOSIS — Z3480 Encounter for supervision of other normal pregnancy, unspecified trimester: Secondary | ICD-10-CM

## 2020-01-11 DIAGNOSIS — N871 Moderate cervical dysplasia: Secondary | ICD-10-CM

## 2020-01-11 NOTE — Progress Notes (Signed)
   PRENATAL VISIT NOTE  Subjective:  Kathleen Ayala is a 29 y.o. G2P1001 at [redacted]w[redacted]d being seen today for ongoing prenatal care.  She is currently monitored for the following issues for this low-risk pregnancy and has Dysplasia of cervix, high grade CIN 2 and Supervision of other normal pregnancy, antepartum on their problem list.  Patient reports no complaints.  Contractions: Irregular. Vag. Bleeding: None.  Movement: Present. Denies leaking of fluid.   The following portions of the patient's history were reviewed and updated as appropriate: allergies, current medications, past family history, past medical history, past social history, past surgical history and problem list.   Objective:   Vitals:   01/11/20 0837  BP: 119/79  Pulse: (!) 101  Weight: 172 lb (78 kg)    Fetal Status: Fetal Heart Rate (bpm): 143 Fundal Height: 37 cm Movement: Present  Presentation: Vertex  General:  Alert, oriented and cooperative. Patient is in no acute distress.  Skin: Skin is warm and dry. No rash noted.   Cardiovascular: Normal heart rate noted  Respiratory: Normal respiratory effort, no problems with respiration noted  Abdomen: Soft, gravid, appropriate for gestational age.  Pain/Pressure: Present     Pelvic: Cervical exam performed in the presence of a chaperone Dilation: 1 Effacement (%): Thick Station: -3  Extremities: Normal range of motion.  Edema: Trace  Mental Status: Normal mood and affect. Normal behavior. Normal judgment and thought content.   Assessment and Plan:  Pregnancy: G2P1001 at [redacted]w[redacted]d  1. Supervision of other normal pregnancy, antepartum Doing well some hand tingling- counseled to get wrist splints Reviewed nursing and pumping  Would like sweeping at 39wk  Discussed IOL at 41 wk if does not go into spontaneous labor  2. Dysplasia of cervix, high grade CIN 2 Pap NIL 01/2019  Term labor symptoms and general obstetric precautions including but not limited to vaginal  bleeding, contractions, leaking of fluid and fetal movement were reviewed in detail with the patient. Please refer to After Visit Summary for other counseling recommendations.   Return in about 1 week (around 01/18/2020) for Routine prenatal care, MD or APP.  Future Appointments  Date Time Provider Department Center  01/18/2020  8:10 AM Calvert Cantor, CNM [facility]  01/25/2020  8:15 AM Federico Flake, MD [facility]    Federico Flake, MD

## 2020-01-12 ENCOUNTER — Encounter: Payer: Self-pay | Admitting: Radiology

## 2020-01-18 ENCOUNTER — Other Ambulatory Visit: Payer: Self-pay

## 2020-01-18 ENCOUNTER — Ambulatory Visit (INDEPENDENT_AMBULATORY_CARE_PROVIDER_SITE_OTHER): Payer: Managed Care, Other (non HMO) | Admitting: Advanced Practice Midwife

## 2020-01-18 ENCOUNTER — Telehealth (HOSPITAL_COMMUNITY): Payer: Self-pay | Admitting: *Deleted

## 2020-01-18 VITALS — BP 125/78 | HR 93 | Wt 173.0 lb

## 2020-01-18 DIAGNOSIS — Z3A38 38 weeks gestation of pregnancy: Secondary | ICD-10-CM

## 2020-01-18 DIAGNOSIS — Z3480 Encounter for supervision of other normal pregnancy, unspecified trimester: Secondary | ICD-10-CM

## 2020-01-18 NOTE — Telephone Encounter (Signed)
Preadmission screen  

## 2020-01-18 NOTE — Progress Notes (Signed)
   PRENATAL VISIT NOTE  Subjective:  Kathleen Ayala is a 29 y.o. G2P1001 at [redacted]w[redacted]d being seen today for ongoing prenatal care.  She is currently monitored for the following issues for this low-risk pregnancy and has Dysplasia of cervix, high grade CIN 2 and Supervision of other normal pregnancy, antepartum on their problem list.  Patient reports no complaints. She purchased splints for her tingling hands and is experiencing relief.  Contractions: Irregular. Vag. Bleeding: None.  Movement: Present. Denies leaking of fluid.   The following portions of the patient's history were reviewed and updated as appropriate: allergies, current medications, past family history, past medical history, past social history, past surgical history and problem list. Problem list updated.  Objective:   Vitals:   01/18/20 0817  BP: 125/78  Pulse: 93  Weight: 173 lb (78.5 kg)    Fetal Status: Fetal Heart Rate (bpm): 144 Fundal Height: 38 cm Movement: Present  Presentation: Vertex  General:  Alert, oriented and cooperative. Patient is in no acute distress.  Skin: Skin is warm and dry. No rash noted.   Cardiovascular: Normal heart rate noted  Respiratory: Normal respiratory effort, no problems with respiration noted  Abdomen: Soft, gravid, appropriate for gestational age.  Pain/Pressure: Present     Pelvic: Cervical exam performed per patient request Dilation: 1 Effacement (%): Thick Station: -3  Extremities: Normal range of motion.  Edema: Trace  Mental Status: Normal mood and affect. Normal behavior. Normal judgment and thought content.   Assessment and Plan:  Pregnancy: G2P1001 at [redacted]w[redacted]d  1. Supervision of other normal pregnancy, antepartum - LOB, routine care - IOL scheduled for 39.6 per patient request. Reviewed typical process for phone call, presenting to L&D, possibility of being bumped - Desires membrane sweep next visit - Confirmed patient knows location of MAU  2. [redacted] weeks gestation of  pregnancy   Term labor symptoms and general obstetric precautions including but not limited to vaginal bleeding, contractions, leaking of fluid and fetal movement were reviewed in detail with the patient. Please refer to After Visit Summary for other counseling recommendations.    Future Appointments  Date Time Provider Department Center  01/25/2020  8:15 AM Federico Flake, MD [facility]    Calvert Cantor, CNM

## 2020-01-18 NOTE — Patient Instructions (Signed)

## 2020-01-23 ENCOUNTER — Telehealth (HOSPITAL_COMMUNITY): Payer: Self-pay | Admitting: *Deleted

## 2020-01-23 ENCOUNTER — Encounter (HOSPITAL_COMMUNITY): Payer: Self-pay | Admitting: *Deleted

## 2020-01-23 NOTE — Telephone Encounter (Signed)
Preadmission screen  

## 2020-01-24 ENCOUNTER — Other Ambulatory Visit: Payer: Self-pay | Admitting: Advanced Practice Midwife

## 2020-01-24 NOTE — Progress Notes (Signed)
Orders placed for IOL  Clayton Bibles, MSN, CNM Certified Nurse Midwife, [employer] for [employer], [facility] 01/24/20 9:22 AM

## 2020-01-25 ENCOUNTER — Other Ambulatory Visit: Payer: Self-pay

## 2020-01-25 ENCOUNTER — Ambulatory Visit (INDEPENDENT_AMBULATORY_CARE_PROVIDER_SITE_OTHER): Payer: Managed Care, Other (non HMO) | Admitting: Family Medicine

## 2020-01-25 VITALS — BP 132/84 | HR 85 | Wt 176.0 lb

## 2020-01-25 DIAGNOSIS — Z3480 Encounter for supervision of other normal pregnancy, unspecified trimester: Secondary | ICD-10-CM

## 2020-01-25 DIAGNOSIS — Z3A39 39 weeks gestation of pregnancy: Secondary | ICD-10-CM

## 2020-01-25 NOTE — Progress Notes (Signed)
   PRENATAL VISIT NOTE  Subjective:  Kathleen Ayala is a 29 y.o. G2P1001 at [redacted]w[redacted]d being seen today for ongoing prenatal care.  She is currently monitored for the following issues for this low-risk pregnancy and has Dysplasia of cervix, high grade CIN 2 and Supervision of other normal pregnancy, antepartum on their problem list.  Patient reports no complaints- having increased BM and cramping for 24 hrs.  Contractions: Irregular. Vag. Bleeding: None.  Movement: Present. Denies leaking of fluid.   The following portions of the patient's history were reviewed and updated as appropriate: allergies, current medications, past family history, past medical history, past social history, past surgical history and problem list.   Objective:   Vitals:   01/25/20 0818  BP: 132/84  Pulse: 85  Weight: 176 lb (79.8 kg)    Fetal Status: Fetal Heart Rate (bpm): 139   Movement: Present     General:  Alert, oriented and cooperative. Patient is in no acute distress.  Skin: Skin is warm and dry. No rash noted.   Cardiovascular: Normal heart rate noted  Respiratory: Normal respiratory effort, no problems with respiration noted  Abdomen: Soft, gravid, appropriate for gestational age.  Pain/Pressure: Present     Pelvic: Cervical exam deferred        Extremities: Normal range of motion.  Edema: Trace  Mental Status: Normal mood and affect. Normal behavior. Normal judgment and thought content.   Assessment and Plan:  Pregnancy: G2P1001 at [redacted]w[redacted]d  1. Supervision of other normal pregnancy, antepartum Discussed membrane sweeping today. Reviewed cochrane review data on membrane sweeping at 39 wks and then at EDD. Reviewed risk of cramping, contractions, bleeding and ROM. Answered patient questions and she agreed to proceed with procedure. Reviewed ways to start labor Discussed labor stations at home for early labor Reviewed [employer] use Aware of 40 wk elective IOL date and process  Term labor  symptoms and general obstetric precautions including but not limited to vaginal bleeding, contractions, leaking of fluid and fetal movement were reviewed in detail with the patient. Please refer to After Visit Summary for other counseling recommendations.   Return in about 1 week (around 02/01/2020) for Routine prenatal care.  Future Appointments  Date Time Provider Department Center  01/30/2020  9:30 AM [facility] None  01/31/2020  6:30 AM [facility] SCHED ROOM [facility] None    Federico Flake, MD

## 2020-01-27 ENCOUNTER — Other Ambulatory Visit: Payer: Self-pay

## 2020-01-27 ENCOUNTER — Encounter (HOSPITAL_COMMUNITY): Payer: Self-pay | Admitting: Obstetrics & Gynecology

## 2020-01-27 ENCOUNTER — Inpatient Hospital Stay (HOSPITAL_COMMUNITY): Payer: Managed Care, Other (non HMO) | Admitting: Anesthesiology

## 2020-01-27 ENCOUNTER — Inpatient Hospital Stay (HOSPITAL_COMMUNITY)
Admission: AD | Admit: 2020-01-27 | Discharge: 2020-01-28 | DRG: 806 | Disposition: A | Discharge status: Home / Self Care | Payer: Managed Care, Other (non HMO) | Attending: Obstetrics and Gynecology | Admitting: Obstetrics and Gynecology

## 2020-01-27 ENCOUNTER — Inpatient Hospital Stay (HOSPITAL_COMMUNITY): Payer: Managed Care, Other (non HMO)

## 2020-01-27 DIAGNOSIS — Z20822 Contact with and (suspected) exposure to covid-19: Secondary | ICD-10-CM | POA: Diagnosis not present

## 2020-01-27 DIAGNOSIS — Z3A39 39 weeks gestation of pregnancy: Secondary | ICD-10-CM | POA: Diagnosis not present

## 2020-01-27 DIAGNOSIS — Z3480 Encounter for supervision of other normal pregnancy, unspecified trimester: Secondary | ICD-10-CM

## 2020-01-27 DIAGNOSIS — Z87410 Personal history of cervical dysplasia: Secondary | ICD-10-CM | POA: Diagnosis not present

## 2020-01-27 DIAGNOSIS — O26893 Other specified pregnancy related conditions, third trimester: Secondary | ICD-10-CM | POA: Diagnosis present

## 2020-01-27 LAB — CBC
HCT: 33.7 % — ABNORMAL LOW (ref 36.0–46.0)
Hemoglobin: 10.4 g/dL — ABNORMAL LOW (ref 12.0–15.0)
MCH: 25.6 pg — ABNORMAL LOW (ref 26.0–34.0)
MCHC: 30.9 g/dL (ref 30.0–36.0)
MCV: 82.8 fL (ref 80.0–100.0)
Platelets: 260 10*3/uL (ref 150–400)
RBC: 4.07 MIL/uL (ref 3.87–5.11)
RDW: 13.7 % (ref 11.5–15.5)
WBC: 9.4 10*3/uL (ref 4.0–10.5)
nRBC: 0 % (ref 0.0–0.2)

## 2020-01-27 LAB — MASSIVE TRANSFUSION PROTOCOL ORDER (BLOOD BANK NOTIFICATION)

## 2020-01-27 LAB — DIC (DISSEMINATED INTRAVASCULAR COAGULATION)PANEL
D-Dimer, Quant: 1.62 ug/mL-FEU — ABNORMAL HIGH (ref 0.00–0.50)
Fibrinogen: 444 mg/dL (ref 210–475)
INR: 1.1 (ref 0.8–1.2)
Platelets: 247 10*3/uL (ref 150–400)
Prothrombin Time: 13.9 seconds (ref 11.4–15.2)
Smear Review: NONE SEEN
aPTT: 27 seconds (ref 24–36)

## 2020-01-27 LAB — RESP PANEL BY RT-PCR (FLU A&B, COVID) ARPGX2
Influenza A by PCR: NEGATIVE
Influenza B by PCR: NEGATIVE
SARS Coronavirus 2 by RT PCR: NEGATIVE

## 2020-01-27 LAB — ABO/RH: ABO/RH(D): O POS

## 2020-01-27 LAB — POSTPARTUM HEMORRHAGE PROTOCOL (BB NOTIFICATION)

## 2020-01-27 LAB — RPR: RPR Ser Ql: NONREACTIVE

## 2020-01-27 MED ORDER — LACTATED RINGERS IV SOLN
500.0000 mL | INTRAVENOUS | Status: DC | PRN
  Administered 2020-01-27: 1000 mL via INTRAVENOUS
  Administered 2020-01-27: 500 mL via INTRAVENOUS

## 2020-01-27 MED ORDER — TRANEXAMIC ACID-NACL 1000-0.7 MG/100ML-% IV SOLN: 1000.0000 mg | INTRAVENOUS | Status: AC

## 2020-01-27 MED ORDER — PHENYLEPHRINE 40 MCG/ML (10ML) SYRINGE FOR IV PUSH (FOR BLOOD PRESSURE SUPPORT): 80.0000 ug | PREFILLED_SYRINGE | INTRAVENOUS | Status: DC | PRN

## 2020-01-27 MED ORDER — SENNOSIDES-DOCUSATE SODIUM 8.6-50 MG PO TABS
2.0000 | ORAL_TABLET | ORAL | Status: DC
  Administered 2020-01-28: 2 via ORAL
  Filled 2020-01-27: qty 2

## 2020-01-27 MED ORDER — BENZOCAINE-MENTHOL 20-0.5 % EX AERO
1.0000 "application " | INHALATION_SPRAY | CUTANEOUS | Status: DC | PRN
  Administered 2020-01-28: 1 via TOPICAL
  Filled 2020-01-27: qty 56

## 2020-01-27 MED ORDER — PHENYLEPHRINE 40 MCG/ML (10ML) SYRINGE FOR IV PUSH (FOR BLOOD PRESSURE SUPPORT)
80.0000 ug | PREFILLED_SYRINGE | INTRAVENOUS | Status: DC | PRN
  Administered 2020-01-27: 80 ug via INTRAVENOUS
  Filled 2020-01-27: qty 10

## 2020-01-27 MED ORDER — TRANEXAMIC ACID-NACL 1000-0.7 MG/100ML-% IV SOLN: 1000.0000 mg | Freq: Once | INTRAVENOUS | Status: DC | PRN

## 2020-01-27 MED ORDER — EPHEDRINE 5 MG/ML INJ: 10.0000 mg | INTRAVENOUS | Status: DC | PRN

## 2020-01-27 MED ORDER — MEDROXYPROGESTERONE ACETATE 150 MG/ML IM SUSP: 150.0000 mg | INTRAMUSCULAR | Status: DC | PRN

## 2020-01-27 MED ORDER — TERBUTALINE SULFATE 1 MG/ML IJ SOLN: 0.2500 mg | Freq: Once | INTRAMUSCULAR | Status: DC | PRN

## 2020-01-27 MED ORDER — ACETAMINOPHEN 325 MG PO TABS: 650.0000 mg | ORAL_TABLET | ORAL | Status: DC | PRN

## 2020-01-27 MED ORDER — PRENATAL MULTIVITAMIN CH
1.0000 | ORAL_TABLET | Freq: Every day | ORAL | Status: DC
  Administered 2020-01-28: 1 via ORAL
  Filled 2020-01-27: qty 1

## 2020-01-27 MED ORDER — IBUPROFEN 600 MG PO TABS
600.0000 mg | ORAL_TABLET | Freq: Four times a day (QID) | ORAL | Status: DC
  Administered 2020-01-28 (×4): 600 mg via ORAL
  Filled 2020-01-27 (×4): qty 1

## 2020-01-27 MED ORDER — LACTATED RINGERS IV SOLN: 500.0000 mL | Freq: Once | INTRAVENOUS | Status: DC

## 2020-01-27 MED ORDER — OXYTOCIN-SODIUM CHLORIDE 30-0.9 UT/500ML-% IV SOLN: 2.5000 [IU]/h | INTRAVENOUS | Status: DC

## 2020-01-27 MED ORDER — WITCH HAZEL-GLYCERIN EX PADS
1.0000 "application " | MEDICATED_PAD | CUTANEOUS | Status: DC | PRN
  Administered 2020-01-28: 1 via TOPICAL

## 2020-01-27 MED ORDER — LIDOCAINE HCL (PF) 1 % IJ SOLN
30.0000 mL | INTRAMUSCULAR | Status: AC | PRN
  Administered 2020-01-27: 30 mL via SUBCUTANEOUS
  Filled 2020-01-27: qty 30

## 2020-01-27 MED ORDER — FERROUS SULFATE 325 (65 FE) MG PO TABS: 325.0000 mg | ORAL_TABLET | ORAL | Status: DC

## 2020-01-27 MED ORDER — ONDANSETRON HCL 4 MG/2ML IJ SOLN
4.0000 mg | Freq: Four times a day (QID) | INTRAMUSCULAR | Status: DC | PRN
  Administered 2020-01-27: 4 mg via INTRAVENOUS
  Filled 2020-01-27: qty 2

## 2020-01-27 MED ORDER — OXYTOCIN-SODIUM CHLORIDE 30-0.9 UT/500ML-% IV SOLN
2.5000 [IU]/h | INTRAVENOUS | Status: DC
  Filled 2020-01-27: qty 500

## 2020-01-27 MED ORDER — LACTATED RINGERS IV SOLN: INTRAVENOUS | Status: DC

## 2020-01-27 MED ORDER — DIPHENHYDRAMINE HCL 50 MG/ML IJ SOLN: 12.5000 mg | INTRAMUSCULAR | Status: DC | PRN

## 2020-01-27 MED ORDER — SOD CITRATE-CITRIC ACID 500-334 MG/5ML PO SOLN: 30.0000 mL | ORAL | Status: DC | PRN

## 2020-01-27 MED ORDER — MISOPROSTOL 200 MCG PO TABS
ORAL_TABLET | ORAL | Status: AC
  Administered 2020-01-27: 800 ug via RECTAL
  Filled 2020-01-27: qty 4

## 2020-01-27 MED ORDER — DIBUCAINE (PERIANAL) 1 % EX OINT: 1.0000 "application " | TOPICAL_OINTMENT | CUTANEOUS | Status: DC | PRN

## 2020-01-27 MED ORDER — TETANUS-DIPHTH-ACELL PERTUSSIS 5-2.5-18.5 LF-MCG/0.5 IM SUSY: 0.5000 mL | PREFILLED_SYRINGE | Freq: Once | INTRAMUSCULAR | Status: DC

## 2020-01-27 MED ORDER — TRANEXAMIC ACID-NACL 1000-0.7 MG/100ML-% IV SOLN
INTRAVENOUS | Status: AC
  Administered 2020-01-27: 1000 mg via INTRAVENOUS
  Filled 2020-01-27: qty 100

## 2020-01-27 MED ORDER — OXYTOCIN-SODIUM CHLORIDE 30-0.9 UT/500ML-% IV SOLN: 10.0000 [IU]/h | INTRAVENOUS | Status: DC

## 2020-01-27 MED ORDER — DIPHENHYDRAMINE HCL 25 MG PO CAPS: 25.0000 mg | ORAL_CAPSULE | Freq: Four times a day (QID) | ORAL | Status: DC | PRN

## 2020-01-27 MED ORDER — LACTATED RINGERS IV SOLN
500.0000 mL | Freq: Once | INTRAVENOUS | Status: AC
  Administered 2020-01-27: 500 mL via INTRAVENOUS

## 2020-01-27 MED ORDER — OXYTOCIN BOLUS FROM INFUSION
333.0000 mL | Freq: Once | INTRAVENOUS | Status: AC
  Administered 2020-01-27: 333 mL via INTRAVENOUS

## 2020-01-27 MED ORDER — LIDOCAINE HCL (PF) 1 % IJ SOLN
INTRAMUSCULAR | Status: DC | PRN
  Administered 2020-01-27: 5 mL via EPIDURAL
  Administered 2020-01-27: 2 mL via EPIDURAL
  Administered 2020-01-27: 3 mL via EPIDURAL

## 2020-01-27 MED ORDER — LACTATED RINGERS IV BOLUS: 1000.0000 mL | Freq: Once | INTRAVENOUS | Status: DC

## 2020-01-27 MED ORDER — MISOPROSTOL 50MCG HALF TABLET
50.0000 ug | ORAL_TABLET | ORAL | Status: DC | PRN
  Administered 2020-01-27: 50 ug via BUCCAL
  Filled 2020-01-27: qty 1

## 2020-01-27 MED ORDER — SODIUM CHLORIDE (PF) 0.9 % IJ SOLN
INTRAMUSCULAR | Status: DC | PRN
  Administered 2020-01-27: 12 mL/h via EPIDURAL

## 2020-01-27 MED ORDER — MEASLES, MUMPS & RUBELLA VAC IJ SOLR: 0.5000 mL | Freq: Once | INTRAMUSCULAR | Status: DC

## 2020-01-27 MED ORDER — COCONUT OIL OIL: 1.0000 "application " | TOPICAL_OIL | Status: DC | PRN

## 2020-01-27 MED ORDER — OXYTOCIN-SODIUM CHLORIDE 30-0.9 UT/500ML-% IV SOLN
1.0000 m[IU]/min | INTRAVENOUS | Status: DC
  Administered 2020-01-27: 2 m[IU]/min via INTRAVENOUS

## 2020-01-27 MED ORDER — ONDANSETRON HCL 4 MG PO TABS: 4.0000 mg | ORAL_TABLET | ORAL | Status: DC | PRN

## 2020-01-27 MED ORDER — MISOPROSTOL 200 MCG PO TABS: 800.0000 ug | ORAL_TABLET | Freq: Once | ORAL | Status: AC

## 2020-01-27 MED ORDER — FENTANYL CITRATE (PF) 100 MCG/2ML IJ SOLN
100.0000 ug | INTRAMUSCULAR | Status: DC | PRN
  Administered 2020-01-27: 100 ug via INTRAVENOUS
  Filled 2020-01-27: qty 2

## 2020-01-27 MED ORDER — SIMETHICONE 80 MG PO CHEW: 80.0000 mg | CHEWABLE_TABLET | ORAL | Status: DC | PRN

## 2020-01-27 MED ORDER — FENTANYL-BUPIVACAINE-NACL 0.5-0.125-0.9 MG/250ML-% EP SOLN
12.0000 mL/h | EPIDURAL | Status: DC | PRN
  Filled 2020-01-27: qty 250

## 2020-01-27 MED ORDER — ONDANSETRON HCL 4 MG/2ML IJ SOLN: 4.0000 mg | INTRAMUSCULAR | Status: DC | PRN

## 2020-01-27 NOTE — Anesthesia Procedure Notes (Signed)
Epidural Patient location during procedure: OB Start time: 01/27/2020 2:30 PM End time: 01/27/2020 2:37 PM  Staffing Anesthesiologist: Marcene Duos, MD Performed: anesthesiologist   Preanesthetic Checklist Completed: patient identified, IV checked, site marked, risks and benefits discussed, surgical consent, monitors and equipment checked, pre-op evaluation and timeout performed  Epidural Patient position: sitting Prep: DuraPrep and site prepped and draped Patient monitoring: continuous pulse ox and blood pressure Approach: midline Location: L4-L5 Injection technique: LOR air  Needle:  Needle type: Tuohy  Needle gauge: 17 G Needle length: 9 cm and 9 Needle insertion depth: 5 cm cm Catheter type: closed end flexible Catheter size: 19 Gauge Catheter at skin depth: 10 cm Test dose: negative  Assessment Events: blood not aspirated, injection not painful, no injection resistance, no paresthesia and negative IV test

## 2020-01-27 NOTE — Progress Notes (Signed)
Faculty Note  Called to room for [facility]. On arrival, she was at 1L of EBL due to uterine atony and 2nd degree perineal laceration with arterial bleeding. Has received pitocin bolus, TXA and was getting cytotec rectally.   I repaired the 2nd degree with 3-0 monocryl in usual fashion to good effect after injecting 4 mL 1% lidocaine.  Also evacuated 400 mL of clots from uterus and uterus became firm, minimal spotting. Care turned back over to [facility]. Pt hemodynamically stable throughout.   Baldemar Lenis, M.D. Attending Center for [employer] Midwife)

## 2020-01-27 NOTE — Anesthesia Preprocedure Evaluation (Signed)
Anesthesia Evaluation  Patient identified by MRN, date of birth, ID band Patient awake    Reviewed: Allergy & Precautions, NPO status , Patient's Chart, lab work & pertinent test results  Airway Mallampati: II  TM Distance: >3 FB     Dental   Pulmonary neg pulmonary ROS,    breath sounds clear to auscultation       Cardiovascular negative cardio ROS   Rhythm:Regular Rate:Normal     Neuro/Psych negative neurological ROS     GI/Hepatic negative GI ROS, Neg liver ROS,   Endo/Other  negative endocrine ROS  Renal/GU negative Renal ROS     Musculoskeletal   Abdominal   Peds  Hematology  (+) anemia ,   Anesthesia Other Findings   Reproductive/Obstetrics (+) Pregnancy                             Lab Results  Component Value Date   WBC 9.4 01/27/2020   HGB 10.4 (L) 01/27/2020   HCT 33.7 (L) 01/27/2020   MCV 82.8 01/27/2020   PLT 260 01/27/2020   Lab Results  Component Value Date   CREATININE 0.72 04/25/2017   BUN 9 04/25/2017   NA 134 (L) 04/25/2017   K 3.8 04/25/2017   CL 103 04/25/2017   CO2 23 04/25/2017    Anesthesia Physical Anesthesia Plan  ASA: II  Anesthesia Plan: Epidural   Post-op Pain Management:    Induction:   PONV Risk Score and Plan: Treatment may vary due to age or medical condition  Airway Management Planned: Natural Airway  Additional Equipment:   Intra-op Plan:   Post-operative Plan:   Informed Consent: I have reviewed the patients History and Physical, chart, labs and discussed the procedure including the risks, benefits and alternatives for the proposed anesthesia with the patient or authorized representative who has indicated his/her understanding and acceptance.       Plan Discussed with:   Anesthesia Plan Comments:         Anesthesia Quick Evaluation

## 2020-01-27 NOTE — Progress Notes (Signed)
Sarha Bartelt is a 29 y.o. G2P1001 at [redacted]w[redacted]d by LMP admitted for induction of labor due to Elective at term.  Subjective: Patient states she's feeling increased pressure with each contraction. Denies the urge to push. Epidural is adequate for pain control. No other complaints at this time.   Objective: BP 113/74   Pulse 72   Temp (!) 97.4 F (36.3 C) (Oral)   Resp 18   Ht 5' 4.5" (1.638 m)   Wt 79.9 kg   LMP 04/27/2019 (Exact Date)   SpO2 100%   BMI 29.78 kg/m  No intake/output data recorded. No intake/output data recorded.  FHT:  FHR: 135 bpm, variability: moderate,  accelerations:  Present,  decelerations:  Absent UC:   regular, every 3-5 minutes SVE:   Dilation: 5 Effacement (%): 80, 70 Station: -2 Exam by:: [employer]  Labs: Lab Results  Component Value Date   WBC 9.4 01/27/2020   HGB 10.4 (L) 01/27/2020   HCT 33.7 (L) 01/27/2020   MCV 82.8 01/27/2020   PLT 260 01/27/2020    Assessment / Plan: Induction of labor due to term, elective ,  progressing well on pitocin  Labor: Progessessing on pitocin Preeclampsia:  n/a Fetal Wellbeing:  Category I Pain Control:  Epidural I/D:  GBS negative Anticipated MOD:  NSVD  Sande Rives 01/27/2020, 5:40 PM

## 2020-01-27 NOTE — Discharge Summary (Signed)
Postpartum Discharge Summary     Patient Name: Kathleen Ayala DOB: Feb 11, 1991 MRN: [id number]  Date of admission: 01/27/2020 Delivery date:01/27/2020  Delivering provider: Marcille Buffy D  Date of discharge: 01/28/2020  Admitting diagnosis: Labor and delivery indication for care or intervention [O75.9] Intrauterine pregnancy: [redacted]w[redacted]d    Secondary diagnosis:  Active Problems:   Labor and delivery indication for care or intervention  Additional problems: none    Discharge diagnosis: Term Pregnancy Delivered                                              Post partum procedures: iron infusion Augmentation: Pitocin and Cytotec Complications: [id number]>[id number] Hospital course: Induction of Labor With Vaginal Delivery   29y.o. yo [id number] admitted to the hospital 01/27/2020 for induction of labor.  Indication for induction: Elective.  Patient had an uncomplicated labor course as follows: Membrane Rupture Time/Date: 4:10 PM ,01/27/2020   Delivery Method:Vaginal, Spontaneous  Episiotomy: None  Lacerations:    Details of delivery can be found in separate delivery note.  Patient had a routine postpartum course. Patient is discharged home 01/28/20.  Newborn Data: Birth date:01/27/2020  Birth time:7:08 PM  Gender:Female  Living status:Living  Apgars:8 ,9  Weight:8 lb 4.6 oz (3.759 kg)   Magnesium Sulfate received: No BMZ received: No Rhophylac:N/A MMR:No T-DaP:Given prenatally Flu: N/A, pt declined Transfusion:Yes - iron  Physical exam  Vitals:   01/27/20 2230 01/28/20 0015 01/28/20 0315 01/28/20 0731  BP: 111/70 125/74 129/80 (P) 129/76  Pulse: 90 89 89 (P) 68  Resp: '18 17 18 ' (P) 20  Temp: 98.5 F (36.9 C) 98.1 F (36.7 C) 98.6 F (37 C) (P) 98.6 F (37 C)  TempSrc: Oral Oral Oral   SpO2: 99% 98% 99%   Weight:      Height:       General: alert, cooperative and no distress Lochia: appropriate Uterine Fundus: firm Incision: N/A DVT Evaluation: No  evidence of DVT seen on physical exam. No cords or calf tenderness. No significant calf/ankle edema. Labs: Lab Results  Component Value Date   WBC 15.5 (H) 01/28/2020   HGB 7.8 (L) 01/28/2020   HCT 24.8 (L) 01/28/2020   MCV 82.4 01/28/2020   PLT 214 01/28/2020   CMP Latest Ref Rng & Units 04/25/2017  Glucose 65 - 99 mg/dL 94  BUN 6 - 20 mg/dL 9  Creatinine 0.44 - 1.00 mg/dL 0.72  Sodium 135 - 145 mmol/L 134(L)  Potassium 3.5 - 5.1 mmol/L 3.8  Chloride 101 - 111 mmol/L 103  CO2 22 - 32 mmol/L 23  Calcium 8.9 - 10.3 mg/dL 8.7(L)   EdFlavia Shippercore: Edinburgh Postnatal Depression Scale Screening Tool 01/28/2020  I have been able to laugh and see the funny side of things. 0  I have looked forward with enjoyment to things. 0  I have blamed myself unnecessarily when things went wrong. 0  I have been anxious or worried for no good reason. 2  I have felt scared or panicky for no good reason. 2  Things have been getting on top of me. 0  I have been so unhappy that I have had difficulty sleeping. 0  I have felt sad or miserable. 0  I have been so unhappy that I have been crying. 0  The thought of harming myself has  occurred to me. 0  Edinburgh Postnatal Depression Scale Total 4     After visit meds:  Allergies as of 01/28/2020   No Known Allergies      Medication List     STOP taking these medications    docusate sodium 100 MG capsule Commonly known as: COLACE   terconazole 0.4 % vaginal cream Commonly known as: TERAZOL 7   terconazole 0.8 % vaginal cream Commonly known as: TERAZOL 3       TAKE these medications    acetaminophen 325 MG tablet Commonly known as: Tylenol Take 2 tablets (650 mg total) by mouth every 4 (four) hours as needed (for pain scale < 4).   ferrous sulfate 325 (65 FE) MG tablet Take 1 tablet (325 mg total) by mouth every other day.   ibuprofen 600 MG tablet Commonly known as: ADVIL Take 1 tablet (600 mg total) by mouth every 6 (six) hours  as needed.   multivitamin-prenatal 27-0.8 MG Tabs tablet Take 1 tablet by mouth daily at 12 noon.   polyethylene glycol 17 g packet Commonly known as: MIRALAX / GLYCOLAX Take 17 g by mouth daily.   senna-docusate 8.6-50 MG tablet Commonly known as: Senokot-S Take 2 tablets by mouth daily. Start taking on: January 29, 2020         Discharge home in stable condition Infant Feeding: Breast Infant Disposition:home with mother Discharge instruction: per After Visit Summary and Postpartum booklet. Activity: Advance as tolerated. Pelvic rest for 6 weeks.  Diet: routine diet Future Appointments: Future Appointments  Date Time Provider [place]  01/30/2020  9:30 AM [facility] None   Follow up Visit:  [place] [facility]. Schedule an appointment as soon as possible for a visit in 1 week(s).   Specialty: Obstetrics and Gynecology Why: BP check 1 week PP visit 4 weeks Contact information: [place] [phone]                 Please schedule this patient for a In person postpartum visit in 4 weeks with the following provider: Any provider. BP check in one week - message sent to [facility] to schedule. Additional Postpartum F/U: none   Low risk pregnancy complicated by:  none Delivery mode:  Vaginal, Spontaneous  Anticipated Birth Control:   vasectomy   Clarisa Fling, NP  1:58 PM 01/28/2020

## 2020-01-27 NOTE — Progress Notes (Signed)
Kathleen Ayala is a 29 y.o. G2P1001 at [redacted]w[redacted]d by LMP admitted for induction of labor due to Elective at term.  Subjective: Patient resting with eyes closed. No distress noted at this time.  Objective: BP 101/63   Pulse 67   Temp 98 F (36.7 C) (Oral)   Resp 16   Ht 5' 4.5" (1.638 m)   Wt 79.9 kg   LMP 04/27/2019 (Exact Date)   SpO2 100%   BMI 29.78 kg/m  No intake/output data recorded. No intake/output data recorded.  FHT:  FHR: 145 bpm, variability: moderate,  accelerations:  Present,  decelerations:  Absent UC:   regular, every 2-6 minutes SVE:   Dilation: 5 Effacement (%): 70 Station: -2 Exam by:: Marti Sleigh, RN  Labs: Lab Results  Component Value Date   WBC 9.4 01/27/2020   HGB 10.4 (L) 01/27/2020   HCT 33.7 (L) 01/27/2020   MCV 82.8 01/27/2020   PLT 260 01/27/2020    Assessment / Plan: Induction of labor due to elective at term,  progressing well on pitocin  Labor: Progressing on Pitocin, will continue to increase then AROM Preeclampsia:  n/a Fetal Wellbeing:  Category I Pain Control:  Epidural I/D:  GBS Negative Anticipated MOD:  NSVD  Sande Rives 01/27/2020, 3:27 PM

## 2020-01-27 NOTE — H&P (Addendum)
OBSTETRIC ADMISSION HISTORY AND PHYSICAL  Kathleen Ayala is a 29 y.o. female G2P1001 with IUP at [redacted]w[redacted]d by LMP presenting for elective induction. She reports +FMs, No LOF, no VB, no blurry vision, headaches or peripheral edema, and RUQ pain.  She plans on breastfeeding. She undecided on birth control, possible vasectomy. She [facility] her [facility]    Dating: By LMP --->  Estimated Date of Delivery: 02/01/20  Sono:  Normal on 10/06/2019  @[redacted]w[redacted]d , CWD, normal anatomy, cephalic presentation, stable lie, 552g, 35% EFW   Prenatal History/Complications:  --Dysplasia of cervix, high grade CIN 2  Past Medical History: Past Medical History:  Diagnosis Date   Vaginal Pap smear, abnormal    cryotherapy    Past Surgical History: Past Surgical History:  Procedure Laterality Date   GYNECOLOGIC CRYOSURGERY     NO PAST SURGERIES      Obstetrical History: OB History     Gravida  2   Para  1   Term  1   Preterm      AB      Living  1      SAB      TAB      Ectopic      Multiple      Live Births  1           Social History Social History   Socioeconomic History   Marital status: Married    Spouse name:   Number of children: 1   Years of education: Not on file   Highest education level: Some college, no degree  Occupational History   Not on file  Tobacco Use   Smoking status: Never Smoker   Smokeless tobacco: Never Used  Vaping Use   Vaping Use: Never used  Substance and Sexual Activity   Alcohol use: Not Currently    Comment: social   Drug use: No   Sexual activity: Yes    Partners: Male    Birth control/protection: None  Other Topics Concern   Not on file  Social History Narrative   Not on file   Social Determinants of Health   Financial Resource Strain:    Difficulty of Paying Living Expenses: Not on file  Food Insecurity:    Worried About Ronaldo Miyamoto in the Last Year: Not on file   Programme researcher, broadcasting/film/video of Food in  the Last Year: Not on file  Transportation Needs:    Lack of Transportation (Medical): Not on file   Lack of Transportation (Non-Medical): Not on file  Physical Activity:    Days of Exercise per Week: Not on file   Minutes of Exercise per Session: Not on file  Stress:    Feeling of Stress : Not on file  Social Connections:    Frequency of Communication with Friends and Family: Not on file   Frequency of Social Gatherings with Friends and Family: Not on file   Attends Religious Services: Not on file   Active Member of Clubs or Organizations: Not on file   Attends [employer] Meetings: Not on file   Marital Status: Not on file    Family History: Family History  Problem Relation Age of Onset   Cancer Other        LUNG   Healthy Mother    Healthy Father    Diabetes Maternal Grandmother    Drug abuse Maternal Grandfather    Hypertension Neg Hx    Stroke  Neg Hx     Allergies: No Known Allergies  Medications Prior to Admission  Medication Sig Dispense Refill Last Dose   docusate sodium (COLACE) 100 MG capsule Take 1 capsule (100 mg total) by mouth 2 (two) times daily as needed. 30 capsule 2 More than a month at Unknown time   polyethylene glycol (MIRALAX / GLYCOLAX) 17 g packet Take 17 g by mouth daily. 14 each 2 More than a month at Unknown time   Prenatal Vit-Fe Fumarate-FA (MULTIVITAMIN-PRENATAL) 27-0.8 MG TABS tablet Take 1 tablet by mouth daily at 12 noon.   More than a month at Unknown time   terconazole (TERAZOL 3) 0.8 % vaginal cream Place 1 applicator vaginally at bedtime. Apply nightly for three nights. (Patient not taking: Reported on 10/19/2019) 20 g 0 More than a month at Unknown time   terconazole (TERAZOL 7) 0.4 % vaginal cream Place 1 applicator vaginally at bedtime. (Patient not taking: Reported on 01/04/2020) 45 g 0 More than a month at Unknown time     Review of Systems   All systems reviewed and negative except as stated in HPI  Blood pressure  128/81, pulse (!) 101, temperature 98 F (36.7 C), temperature source Oral, resp. rate 16, height 5' 4.5" (1.638 m), weight 79.9 kg, last menstrual period 04/27/2019. General appearance: alert, cooperative, appears stated age and no distress Lungs: clear to auscultation bilaterally Heart: regular rate and rhythm Abdomen: soft, non-tender; bowel sounds normal Pelvic: adequate for delivery Extremities: Homans sign is negative, no sign of DVT Presentation: cephalic Fetal monitoringBaseline: 140 bpm Uterine activity: Irregular, patient reports cramping Dilation: 2 Effacement (%): 50 Station: -3 Exam by:: Wilfred Lacy RN   Prenatal labs: ABO, Rh: --/--/O POS (12/03 9381) Antibody: NEG (12/03 0175) Rubella: 5.00 (05/12 0839) RPR: Non Reactive (09/08 0940)  HBsAg: Negative (05/12 0839)  HIV: Non Reactive (09/08 0944)  GBS: Negative/-- (11/10 0923)  1 hr Glucola 119 Genetic screening: NIPS: low risk Anatomy US - Normal with normal follow up  Prenatal Transfer Tool  Maternal Diabetes: No Genetic Screening: Normal Maternal Ultrasounds/Referrals: Normal Fetal Ultrasounds or other Referrals:  None Maternal Substance Abuse:  No Significant Maternal Medications:  None Significant Maternal Lab Results: Group B Strep negative  Results for orders placed or performed during the hospital encounter of 01/27/20 (from the past 24 hour(s))  CBC   Collection Time: 01/27/20  8:39 AM  Result Value Ref Range   WBC 9.4 4.0 - 10.5 K/uL   RBC 4.07 3.87 - 5.11 MIL/uL   Hemoglobin 10.4 (L) 12.0 - 15.0 g/dL   HCT 10.2 (L) 36 - 46 %   MCV 82.8 80.0 - 100.0 fL   MCH 25.6 (L) 26.0 - 34.0 pg   MCHC 30.9 30.0 - 36.0 g/dL   RDW 58.5 27.7 - 82.4 %   Platelets 260 150 - 400 K/uL   nRBC 0.0 0.0 - 0.2 %  Type and screen   Collection Time: 01/27/20  8:39 AM  Result Value Ref Range   ABO/RH(D) O POS    Antibody Screen NEG    Sample Expiration      01/30/2020,2359 Performed at [facility],  [address] [place], [place] [address]     Patient Active Problem List   Diagnosis Date Noted   Labor and delivery indication for care or intervention 01/27/2020   Supervision of other normal pregnancy, antepartum 07/06/2019   Dysplasia of cervix, high grade CIN 2 05/24/2015    Assessment/Plan:  Kathleen Ayala is a 29 y.o. G2P1001 at [redacted]w[redacted]d here for elective induction of labor.  #Labor: Induction of labor - Cytotec 50 mcg buccal given at 0850 #Pain: Desires epidural  #FWB: Cat I tracing #ID: GBS Negative #MOF: breastfeeding #MOC: undecided, possible vasectomy  #Circ: n/a  Sande Rives, Student-MidWife  01/27/2020, 10:08 AM   Attestation of Supervision of Student:  I confirm that I have verified the information documented in the nurse midwife student's note and that I have also personally reperformed the history, physical exam and all medical decision making activities.  I have verified that all services and findings are accurately documented in this student's note; and I agree with management and plan as outlined in the documentation. I have also made any necessary editorial changes.  Thressa Sheller DNP, CNM  01/27/20  12:00 PM

## 2020-01-28 ENCOUNTER — Ambulatory Visit: Payer: Self-pay

## 2020-01-28 LAB — CBC
HCT: 24.8 % — ABNORMAL LOW (ref 36.0–46.0)
Hemoglobin: 7.8 g/dL — ABNORMAL LOW (ref 12.0–15.0)
MCH: 25.9 pg — ABNORMAL LOW (ref 26.0–34.0)
MCHC: 31.5 g/dL (ref 30.0–36.0)
MCV: 82.4 fL (ref 80.0–100.0)
Platelets: 214 10*3/uL (ref 150–400)
RBC: 3.01 MIL/uL — ABNORMAL LOW (ref 3.87–5.11)
RDW: 13.7 % (ref 11.5–15.5)
WBC: 15.5 10*3/uL — ABNORMAL HIGH (ref 4.0–10.5)
nRBC: 0 % (ref 0.0–0.2)

## 2020-01-28 MED ORDER — IBUPROFEN 600 MG PO TABS: 600.0000 mg | ORAL_TABLET | Freq: Four times a day (QID) | ORAL | 0 refills | Status: DC | PRN

## 2020-01-28 MED ORDER — FERROUS SULFATE 325 (65 FE) MG PO TABS: 325.0000 mg | ORAL_TABLET | ORAL | 3 refills | Status: DC

## 2020-01-28 MED ORDER — ACETAMINOPHEN 325 MG PO TABS: 650.0000 mg | ORAL_TABLET | ORAL | 1 refills | Status: DC | PRN

## 2020-01-28 MED ORDER — SENNOSIDES-DOCUSATE SODIUM 8.6-50 MG PO TABS: 2.0000 | ORAL_TABLET | ORAL | 1 refills | Status: DC

## 2020-01-28 MED ORDER — SODIUM CHLORIDE 0.9 % IV SOLN
500.0000 mg | Freq: Once | INTRAVENOUS | Status: AC
  Administered 2020-01-28: 500 mg via INTRAVENOUS
  Filled 2020-01-28: qty 25

## 2020-01-28 NOTE — Discharge Instructions (Signed)
Postpartum Care After Vaginal Delivery °This sheet gives you information about how to care for yourself from the time you deliver your baby to up to 6-12 weeks after delivery (postpartum period). Your health care provider may also give you more specific instructions. If you have problems or questions, contact your health care provider. °Follow these instructions at home: °Vaginal bleeding °· It is normal to have vaginal bleeding (lochia) after delivery. Wear a sanitary pad for vaginal bleeding and discharge. °? During the first week after delivery, the amount and appearance of lochia is often similar to a menstrual period. °? Over the next few weeks, it will gradually decrease to a dry, yellow-brown discharge. °? For most women, lochia stops completely by 4-6 weeks after delivery. Vaginal bleeding can vary from woman to woman. °· Change your sanitary pads frequently. Watch for any changes in your flow, such as: °? A sudden increase in volume. °? A change in color. °? Large blood clots. °· If you pass a blood clot from your vagina, save it and call your health care provider to discuss. Do not flush blood clots down the toilet before talking with your health care provider. °· Do not use tampons or douches until your health care provider says this is safe. °· If you are not breastfeeding, your period should return 6-8 weeks after delivery. If you are feeding your child breast milk only (exclusive breastfeeding), your period may not return until you stop breastfeeding. °Perineal care °· Keep the area between the vagina and the anus (perineum) clean and dry as told by your health care provider. Use medicated pads and pain-relieving sprays and creams as directed. °· If you had a cut in the perineum (episiotomy) or a tear in the vagina, check the area for signs of infection until you are healed. Check for: °? More redness, swelling, or pain. °? Fluid or blood coming from the cut or tear. °? Warmth. °? Pus or a bad  smell. °· You may be given a squirt bottle to use instead of wiping to clean the perineum area after you go to the bathroom. As you start healing, you may use the squirt bottle before wiping yourself. Make sure to wipe gently. °· To relieve pain caused by an episiotomy, a tear in the vagina, or swollen veins in the anus (hemorrhoids), try taking a warm sitz bath 2-3 times a day. A sitz bath is a warm water bath that is taken while you are sitting down. The water should only come up to your hips and should cover your buttocks. °Breast care °· Within the first few days after delivery, your breasts may feel heavy, full, and uncomfortable (breast engorgement). Milk may also leak from your breasts. Your health care provider can suggest ways to help relieve the discomfort. Breast engorgement should go away within a few days. °· If you are breastfeeding: °? Wear a bra that supports your breasts and fits you well. °? Keep your nipples clean and dry. Apply creams and ointments as told by your health care provider. °? You may need to use breast pads to absorb milk that leaks from your breasts. °? You may have uterine contractions every time you breastfeed for up to several weeks after delivery. Uterine contractions help your uterus return to its normal size. °? If you have any problems with breastfeeding, work with your health care provider or lactation consultant. °· If you are not breastfeeding: °? Avoid touching your breasts a lot. Doing this can make   your breasts produce more milk. °? Wear a good-fitting bra and use cold packs to help with swelling. °? Do not squeeze out (express) milk. This causes you to make more milk. °Intimacy and sexuality °· Ask your health care provider when you can engage in sexual activity. This may depend on: °? Your risk of infection. °? How fast you are healing. °? Your comfort and desire to engage in sexual activity. °· You are able to get pregnant after delivery, even if you have not had  your period. If desired, talk with your health care provider about methods of birth control (contraception). °Medicines °· Take over-the-counter and prescription medicines only as told by your health care provider. °· If you were prescribed an antibiotic medicine, take it as told by your health care provider. Do not stop taking the antibiotic even if you start to feel better. °Activity °· Gradually return to your normal activities as told by your health care provider. Ask your health care provider what activities are safe for you. °· Rest as much as possible. Try to rest or take a nap while your baby is sleeping. °Eating and drinking ° °· Drink enough fluid to keep your urine pale yellow. °· Eat high-fiber foods every day. These may help prevent or relieve constipation. High-fiber foods include: °? Whole grain cereals and breads. °? Brown rice. °? Beans. °? Fresh fruits and vegetables. °· Do not try to lose weight quickly by cutting back on calories. °· Take your prenatal vitamins until your postpartum checkup or until your health care provider tells you it is okay to stop. °Lifestyle °· Do not use any products that contain nicotine or tobacco, such as cigarettes and e-cigarettes. If you need help quitting, ask your health care provider. °· Do not drink alcohol, especially if you are breastfeeding. °General instructions °· Keep all follow-up visits for you and your baby as told by your health care provider. Most women visit their health care provider for a postpartum checkup within the first 3-6 weeks after delivery. °Contact a health care provider if: °· You feel unable to cope with the changes that your child brings to your life, and these feelings do not go away. °· You feel unusually sad or worried. °· Your breasts become red, painful, or hard. °· You have a fever. °· You have trouble holding urine or keeping urine from leaking. °· You have little or no interest in activities you used to enjoy. °· You have not  breastfed at all and you have not had a menstrual period for 12 weeks after delivery. °· You have stopped breastfeeding and you have not had a menstrual period for 12 weeks after you stopped breastfeeding. °· You have questions about caring for yourself or your baby. °· You pass a blood clot from your vagina. °Get help right away if: °· You have chest pain. °· You have difficulty breathing. °· You have sudden, severe leg pain. °· You have severe pain or cramping in your lower abdomen. °· You bleed from your vagina so much that you fill more than one sanitary pad in one hour. Bleeding should not be heavier than your heaviest period. °· You develop a severe headache. °· You faint. °· You have blurred vision or spots in your vision. °· You have bad-smelling vaginal discharge. °· You have thoughts about hurting yourself or your baby. °If you ever feel like you may hurt yourself or others, or have thoughts about taking your own life, get help   right away. You can go to the nearest emergency department or call:  Your local emergency services (911 in the [place]  A suicide crisis helpline, such as the National Suicide Prevention Lifeline at 252-448-3663. This is open 24 hours a day. Summary  The period of time right after you deliver your newborn up to 6-12 weeks after delivery is called the postpartum period.  Gradually return to your normal activities as told by your health care provider.  Keep all follow-up visits for you and your baby as told by your health care provider. This information is not intended to replace advice given to you by your health care provider. Make sure you discuss any questions you have with your health care provider. Document Revised: 02/13/2017 Document Reviewed: 11/24/2016 Elsevier Patient Education  2020 [employer].    You have constipation which is hard stools that are difficult to pass. It is important to have regular bowel movements every 1-3 days that are soft and easy  to pass. Hard stools increase your risk of hemorrhoids and are very uncomfortable.   To prevent constipation you can increase the amount of fiber in your diet. Examples of foods with fiber are leafy greens, whole grain breads, oatmeal and other grains.  It is also important to drink at least eight 8oz glass of water everyday.   If you have not has a bowel movement in 4-5 days you made need to clean out your bowel.  This will have establish normal movement through your bowel.    Miralax Clean out  Take 8 capfuls of miralax in 64 oz of gatorade. You can use any fluid that appeals to you (gatorade, water, juice)  Continue to drink at least eight 8 oz glasses of water throughout the day  You can repeat with another 8 capfuls of miralax in 64 oz of gatorade if you are not having a large amount of stools  You will need to be at home and close to a bathroom for about 8 hours when you do the above as you may need to go to the bathroom frequently.   After you are cleaned out: - Start Colace100mg  twice daily - Start Miralax once daily - Start a daily fiber supplement like metamucil or citrucel - You can safely use enemas in pregnancy  - if you are having diarrhea you can reduce to Colace once a day or miralax every other day or a 1/2 capful daily.

## 2020-01-28 NOTE — Anesthesia Postprocedure Evaluation (Signed)
Anesthesia Post Note  Patient: Kathleen Ayala  Procedure(s) Performed: AN AD HOC LABOR EPIDURAL     Patient location during evaluation: Mother Baby Anesthesia Type: Epidural Level of consciousness: awake Pain management: satisfactory to patient Vital Signs Assessment: post-procedure vital signs reviewed and stable Respiratory status: spontaneous breathing Cardiovascular status: stable Anesthetic complications: no   No complications documented.  Last Vitals:  Vitals:   01/28/20 0315 01/28/20 0731  BP: 129/80 (P) 129/76  Pulse: 89 (P) 68  Resp: 18 (P) 20  Temp: 37 C (P) 37 C  SpO2: 99%     Last Pain:  Vitals:   01/28/20 0731  TempSrc:   PainSc: 2    Pain Goal: Patients Stated Pain Goal: 0 (01/27/20 1600)              Epidural/Spinal Function Cutaneous sensation: Normal sensation (01/28/20 0731)  Cephus Shelling

## 2020-01-28 NOTE — Progress Notes (Shared)
Patient ID: Gaynel Schaafsma, female   DOB: Jun 25, 1990, 29 y.o.   MRN: [id number] POSTPARTUM PROGRESS NOTE  Subjective: Mikenzi Raysor is a 29 y.o. [id number] PPD#1 from VD at [redacted]w[redacted]d. 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  She reports she doing well. No acute events overnight. She denies any problems with ambulating or po intake. Denies nausea or vomiting. She has not passed flatus. Pain is moderately controlled. Vaginal bleeding is decreasing. Foley was taken out this morning. Pt wishes to go home today.  Objective: Blood pressure 129/80, pulse 89, temperature 98.6 F (37 C), temperature source Oral, resp. rate 18, height 5' 4.5" (1.638 m), weight 79.9 kg, last menstrual period 04/27/2019, SpO2 99 %, unknown if currently breastfeeding.  Physical Exam:  General: alert, cooperative and no distress Chest: no respiratory distress Abdomen: soft, non-tender  Uterine Fundus: firm and at level of umbilicus Extremities: No calf swelling or tenderness  Recent Labs    01/27/20 0839 01/28/20 0243  HGB 10.4* 7.8*  HCT 33.7* 24.8*    Assessment/Plan: Corynne Scibilia is a 29 y.o. [id number] PPD#1 from VD at [redacted]w[redacted]d for IOL.  Routine Postpartum Care: Doing well, pain well-controlled.  -- Continue routine care, lactation support  -- Contraception: Partner vasectomy -- Feeding: Breast -- Urine Retention: Foley out this AM; 24h UOP 1500. F/u TOV  PPH: Hb to 7.8, Hypotension resolved at 2100, BP nl and stable -- IV Iron  Dispo: Plan for discharge tomorrow.  Terrill Mohr, Medical Student 01/28/2020 7:25 AM

## 2020-01-28 NOTE — Lactation Note (Signed)
This note was copied from a baby's chart. Lactation Consultation Note  Patient Name: Kathleen Ayala EFEOF'H Date: 01/28/2020 Reason for consult: Initial assessment  Initial visit to 24 hours old infant with 2.50% weight loss at time of visit. Mother is a P2. Mother is placing infant in car-seat and ready to leave upon LC arrival. Mother states breastfeeding is going well and has no questions. Provided [facility] services brochure and encouraged to contact LC for support, questions or concerns.    Consult to lactation placed at 1654.   Maternal Data Formula Feeding for Exclusion: No  Feeding Feeding Type: Breast Fed  LATCH Score Latch: Repeated attempts needed to sustain latch, nipple held in mouth throughout feeding, stimulation needed to elicit sucking reflex.  Audible Swallowing: A few with stimulation  Type of Nipple: Flat  Comfort (Breast/Nipple): Soft / non-tender  Hold (Positioning): Assistance needed to correctly position infant at breast and maintain latch.  LATCH Score: 6  Interventions Interventions: Breast feeding basics reviewed  Lactation Tools Discussed/Used     Consult Status Consult Status: Complete    Kathleen Ayala 01/28/2020, 7:44 PM

## 2020-01-30 ENCOUNTER — Inpatient Hospital Stay (HOSPITAL_COMMUNITY)
Admission: AD | Admit: 2020-01-30 | Discharge: 2020-01-31 | Disposition: A | Discharge status: Home / Self Care | Payer: Managed Care, Other (non HMO) | Attending: Obstetrics and Gynecology | Admitting: Obstetrics and Gynecology

## 2020-01-30 ENCOUNTER — Encounter (HOSPITAL_COMMUNITY): Payer: Self-pay | Admitting: Obstetrics and Gynecology

## 2020-01-30 ENCOUNTER — Other Ambulatory Visit: Payer: Self-pay

## 2020-01-30 ENCOUNTER — Other Ambulatory Visit (HOSPITAL_COMMUNITY): Payer: Managed Care, Other (non HMO)

## 2020-01-30 DIAGNOSIS — O9089 Other complications of the puerperium, not elsewhere classified: Secondary | ICD-10-CM | POA: Insufficient documentation

## 2020-01-30 DIAGNOSIS — M79605 Pain in left leg: Secondary | ICD-10-CM | POA: Insufficient documentation

## 2020-01-30 NOTE — MAU Note (Signed)
PT SAYS SHE DELIVERED VAG ON 01-27-20. C/O LEFT ARM WHERE HER IV WAS IS RED AND SORE AND HOT TO TOUCH ,. AND BACK OF LEFT LEG - FEELS IT PULSATING - WORRIED ABOUT A BLOOD CLOT IN LEG.

## 2020-01-31 ENCOUNTER — Ambulatory Visit (HOSPITAL_COMMUNITY): Payer: Managed Care, Other (non HMO) | Attending: Certified Nurse Midwife

## 2020-01-31 ENCOUNTER — Inpatient Hospital Stay (HOSPITAL_COMMUNITY): Payer: Managed Care, Other (non HMO)

## 2020-01-31 DIAGNOSIS — O9089 Other complications of the puerperium, not elsewhere classified: Secondary | ICD-10-CM | POA: Diagnosis not present

## 2020-01-31 DIAGNOSIS — M79605 Pain in left leg: Secondary | ICD-10-CM | POA: Diagnosis present

## 2020-01-31 LAB — TYPE AND SCREEN
ABO/RH(D): O POS
Antibody Screen: NEGATIVE
Unit division: 0
Unit division: 0

## 2020-01-31 LAB — BPAM RBC
Blood Product Expiration Date: 202201042359
Blood Product Expiration Date: 202201042359
Unit Type and Rh: 5100
Unit Type and Rh: 5100

## 2020-01-31 NOTE — MAU Provider Note (Signed)
First Provider Initiated Contact with Patient 01/30/20 2351     S Ms. Kathleen Ayala is a 30 y.o. [id number] patient who is 4 days PP from [facility] on 01/27/20 who presents to MAU today with complaint of knot wear IV was placed and pain in left leg. Patient reports that the knot in arm started getting more sore and hot to touch this evening - concerned that it might be a blood clot. Patient has not tried heat or ice to help with knot.   Patient reports pain in the back of her leg and increased swelling that started today as well. She reports the back of her leg has tingling pulsating feeling and concerned it may be a blood clot as well. She denies tenderness or leg being hot to touch.    O BP 126/82 (BP Location: Right Arm)   Pulse 83   Temp 98.3 F (36.8 C) (Oral)   Resp 18   Ht 5' 4.5" (1.638 m)   Wt 73.8 kg   LMP 04/27/2019 (Exact Date)   BMI 27.48 kg/m  Physical Exam Vitals and nursing note reviewed.  HENT:     Head: Normocephalic.  Cardiovascular:     Rate and Rhythm: Normal rate and regular rhythm.  Pulmonary:     Effort: Pulmonary effort is normal. No respiratory distress.     Breath sounds: Normal breath sounds. No wheezing.  Musculoskeletal:     Right lower leg: No tenderness. Edema present.     Left lower leg: No tenderness. Edema present.     Comments: Negative Homan's sign. Trace edema in pedal and ankles. No tenderness or heat with palpation of calf   Neurological:     Mental Status: She is alert and oriented to person, place, and time.  Psychiatric:        Mood and Affect: Mood normal.        Behavior: Behavior normal.        Thought Content: Thought content normal.    A Medical screening exam complete 1. Postpartum care following vaginal delivery   2. Leg pain, posterior, left    P Discharge from MAU in stable condition Educated and discussed to use heating pads and ice to help with hematoma and tissue bruising from difficult IV stick  Discussed with patient  that it is unlikely for her to have DVT based on examination but will order vascular US to r/o completely - patient to present in the morning for evaluation at vascular US  Warning signs for worsening condition that would warrant emergency follow-up discussed Patient may return to MAU as needed   Sharyon Cable, CNM 01/31/2020 12:42 AM

## 2020-01-31 NOTE — Discharge Instructions (Signed)
GO TO [facility] ADMISSIONS AFTER 8AM ON 01/31/20 FOR VASCULAR ULTRASOUND

## 2020-02-01 ENCOUNTER — Other Ambulatory Visit: Payer: Self-pay

## 2020-02-01 ENCOUNTER — Encounter: Payer: Self-pay | Admitting: Advanced Practice Midwife

## 2020-02-01 ENCOUNTER — Ambulatory Visit (INDEPENDENT_AMBULATORY_CARE_PROVIDER_SITE_OTHER): Payer: Managed Care, Other (non HMO) | Admitting: Advanced Practice Midwife

## 2020-02-01 VITALS — BP 134/82 | HR 94

## 2020-02-01 DIAGNOSIS — L03114 Cellulitis of left upper limb: Secondary | ICD-10-CM | POA: Diagnosis not present

## 2020-02-01 MED ORDER — CEPHALEXIN 500 MG PO CAPS: 500.0000 mg | ORAL_CAPSULE | Freq: Three times a day (TID) | ORAL | 0 refills | Status: DC

## 2020-02-01 NOTE — Progress Notes (Signed)
   Subjective:    Patient ID: Kathleen Ayala, female    DOB: 12-16-1990, 29 y.o.   MRN: [id number]  HPI [name] is a 29 y.o. [id number] s/p vaginal delivery 12/3/2. She c/o redness to the left forearm s/p IV insertion during delivery. She reports going to the MAU 3 days ago but at that time was only a small area of redness. She was treated for inflammation.  The area has now increased in size of redness and is tender to touch.    Review of Systems  Skin: Positive for color change.  All other systems reviewed and are negative.      Objective: BP 134/82   Pulse 94   LMP 04/27/2019 (Exact Date)     Physical Exam HENT:     Head: Normocephalic.  Eyes:     Conjunctiva/sclera: Conjunctivae normal.  Cardiovascular:     Rate and Rhythm: Normal rate.  Pulmonary:     Effort: Pulmonary effort is normal.  Musculoskeletal:     Cervical back: Normal range of motion.  Skin:    General: Skin is warm.     Findings: Erythema present.          Comments: Approximately 2.5 cm area of erythema with increased warmth and tenderness on palpation left forearm palmar aspect.   Neurological:     Mental Status: She is alert.  Psychiatric:        Mood and Affect: Mood normal.       Assessment & Plan:  Cellulitis of left forearm - Plan: cephALEXin (KEFLEX) 500 MG capsule Ibuprofen for pain and inflammation. Return in 2 days for recheck.   [address] RN, FNP, [facility]

## 2020-02-01 NOTE — Patient Instructions (Signed)

## 2020-02-02 LAB — SURGICAL PATHOLOGY

## 2020-02-03 ENCOUNTER — Ambulatory Visit (INDEPENDENT_AMBULATORY_CARE_PROVIDER_SITE_OTHER): Payer: Managed Care, Other (non HMO) | Admitting: *Deleted

## 2020-02-03 ENCOUNTER — Other Ambulatory Visit: Payer: Self-pay

## 2020-02-03 VITALS — BP 117/79 | HR 92

## 2020-02-03 DIAGNOSIS — L03114 Cellulitis of left upper limb: Secondary | ICD-10-CM

## 2020-02-03 NOTE — Progress Notes (Signed)
Patient was assessed and managed by nursing staff during this encounter. I have reviewed the chart and agree with the documentation and plan. I have also made any necessary editorial changes.  Jaynie Collins, MD 02/03/2020 10:32 AM

## 2020-02-03 NOTE — Progress Notes (Signed)
Pt here today to recheck cellulitis left forearm and recheck BP.   BP in office 117/79.  Site on left arm improving.   Pt to continue antibiotics and to follow up prn and or at postpartum visit.

## 2020-02-29 ENCOUNTER — Other Ambulatory Visit: Payer: Self-pay

## 2020-02-29 ENCOUNTER — Ambulatory Visit (INDEPENDENT_AMBULATORY_CARE_PROVIDER_SITE_OTHER): Payer: Managed Care, Other (non HMO) | Admitting: Advanced Practice Midwife

## 2020-02-29 DIAGNOSIS — Z8759 Personal history of other complications of pregnancy, childbirth and the puerperium: Secondary | ICD-10-CM

## 2020-02-29 LAB — CBC
Hematocrit: 42.8 % (ref 34.0–46.6)
Hemoglobin: 12.8 g/dL (ref 11.1–15.9)
MCH: 25 pg — ABNORMAL LOW (ref 26.6–33.0)
MCHC: 29.9 g/dL — ABNORMAL LOW (ref 31.5–35.7)
MCV: 84 fL (ref 79–97)
Platelets: 315 10*3/uL (ref 150–450)
RBC: 5.11 x10E6/uL (ref 3.77–5.28)
RDW: 16 % — ABNORMAL HIGH (ref 11.7–15.4)
WBC: 5.7 10*3/uL (ref 3.4–10.8)

## 2020-02-29 NOTE — Patient Instructions (Signed)
Perinatal Depression When a woman feels excessive sadness, anger, or anxiety during pregnancy or during the first 12 months after she gives birth, she has a condition called perinatal depression. Depression can interfere with work, school, relationships, and other everyday activities. If it is not managed properly, it can also cause problems in the mother and her baby. Sometimes, perinatal depression is left untreated because symptoms are thought to be normal mood swings during and right after pregnancy. If you have symptoms of depression, it is important to talk with your health care provider. What are the causes? The exact cause of this condition is not known. Hormonal changes during and after pregnancy may play a role in causing perinatal depression. What increases the risk? You are more likely to develop this condition if:  You have a personal or family history of depression, anxiety, or mood disorders.  You experience a stressful life event during pregnancy, such as the death of a loved one.  You have a lot of regular life stress.  You do not have support from family members or loved ones, or you are in an abusive relationship. What are the signs or symptoms? Symptoms of this condition include:  Feeling sad or hopeless.  Feelings of guilt.  Feeling irritable or overwhelmed.  Changes in your appetite.  Lack of energy or motivation.  Sleep problems.  Difficulty concentrating or completing tasks.  Loss of interest in hobbies or relationships.  Headaches or stomach problems that do not go away. How is this diagnosed? This condition is diagnosed based on a physical exam and mental evaluation. In some cases, your health care provider may use a depression screening tool. These tools include a list of questions that can help a health care provider diagnose depression. Your health care provider may refer you to a mental health expert who specializes in depression. How is this  treated? This condition may be treated with:  Medicines. Your health care provider will only give you medicines that have been proven safe for pregnancy and breastfeeding.  Talk therapy with a mental health professional to help change your patterns of thinking (cognitive behavioral therapy).  Support groups.  Brain stimulation or light therapies.  Stress reduction therapies, such as mindfulness. Follow these instructions at home: Lifestyle  Do not use any products that contain nicotine or tobacco, such as cigarettes and e-cigarettes. If you need help quitting, ask your health care provider.  Do not use alcohol when you are pregnant. After your baby is born, limit alcohol intake to no more than 1 drink a day. One drink equals 12 oz of beer, 5 oz of wine, or 1 oz of hard liquor.  Consider joining a support group for new mothers. Ask your health care provider for recommendations.  Take good care of yourself. Make sure you: ? Get plenty of sleep. If you are having trouble sleeping, talk with your health care provider. ? Eat a healthy diet. This includes plenty of fruits and vegetables, whole grains, and lean proteins. ? Exercise regularly, as told by your health care provider. Ask your health care provider what exercises are safe for you. General instructions  Take over-the-counter and prescription medicines only as told by your health care provider.  Talk with your partner or family members about your feelings during pregnancy. Share any concerns or anxieties that you may have.  Ask for help with tasks or chores when you need it. Ask friends and family members to provide meals, watch your children, or help with   with cleaning.  Keep all follow-up visits as told by your health care provider. This is important. Contact a health care provider if:  You (or people close to you) notice that you have any symptoms of depression.  You have depression and your symptoms get worse.  You  experience side effects from medicines, such as nausea or sleep problems. Get help right away if:  You feel like hurting yourself, your baby, or someone else. If you ever feel like you may hurt yourself or others, or have thoughts about taking your own life, get help right away. You can go to your nearest emergency department or call:  Your local emergency services (911 in the [place]  A suicide crisis helpline, such as the National Suicide Prevention Lifeline at 1-800-273-8255. This is open 24 hours a day. Summary  Perinatal depression is when a woman feels excessive sadness, anger, or anxiety during pregnancy or during the first 12 months after she gives birth.  If perinatal depression is not treated, it can lead to health problems for the mother and her baby.  This condition is treated with medicines, talk therapy, stress reduction therapies, or a combination of two or more treatments.  Talk with your partner or family members about your feelings. Do not be afraid to ask for help. This information is not intended to replace advice given to you by your health care provider. Make sure you discuss any questions you have with your health care provider. Document Revised: 07/28/2018 Document Reviewed: 04/09/2016 Elsevier Patient Education  2020 Elsevier Inc.   Preventive Care 21-39 Years Old, Female Preventive care refers to visits with your health care provider and lifestyle choices that can promote health and wellness. This includes:  A yearly physical exam. This may also be called an annual well check.  Regular dental visits and eye exams.  Immunizations.  Screening for certain conditions.  Healthy lifestyle choices, such as eating a healthy diet, getting regular exercise, not using drugs or products that contain nicotine and tobacco, and limiting alcohol use. What can I expect for my preventive care visit? Physical exam Your health care provider will check your:  Height and  weight. This may be used to calculate body mass index (BMI), which tells if you are at a healthy weight.  Heart rate and blood pressure.  Skin for abnormal spots. Counseling Your health care provider may ask you questions about your:  Alcohol, tobacco, and drug use.  Emotional well-being.  Home and relationship well-being.  Sexual activity.  Eating habits.  Work and work environment.  Method of birth control.  Menstrual cycle.  Pregnancy history. What immunizations do I need?  Influenza (flu) vaccine  This is recommended every year. Tetanus, diphtheria, and pertussis (Tdap) vaccine  You may need a Td booster every 10 years. Varicella (chickenpox) vaccine  You may need this if you have not been vaccinated. Human papillomavirus (HPV) vaccine  If recommended by your health care provider, you may need three doses over 6 months. Measles, mumps, and rubella (MMR) vaccine  You may need at least one dose of MMR. You may also need a second dose. Meningococcal conjugate (MenACWY) vaccine  One dose is recommended if you are age 19-21 years and a first-year college student living in a residence hall, or if you have one of several medical conditions. You may also need additional booster doses. Pneumococcal conjugate (PCV13) vaccine  You may need this if you have certain conditions and were not previously vaccinated. Pneumococcal polysaccharide (  PPSV23) vaccine  You may need one or two doses if you smoke cigarettes or if you have certain conditions. Hepatitis A vaccine  You may need this if you have certain conditions or if you travel or work in places where you may be exposed to hepatitis A. Hepatitis B vaccine  You may need this if you have certain conditions or if you travel or work in places where you may be exposed to hepatitis B. Haemophilus influenzae type b (Hib) vaccine  You may need this if you have certain conditions. You may receive vaccines as individual  doses or as more than one vaccine together in one shot (combination vaccines). Talk with your health care provider about the risks and benefits of combination vaccines. What tests do I need?  Blood tests  Lipid and cholesterol levels. These may be checked every 5 years starting at age 20.  Hepatitis C test.  Hepatitis B test. Screening  Diabetes screening. This is done by checking your blood sugar (glucose) after you have not eaten for a while (fasting).  Sexually transmitted disease (STD) testing.  BRCA-related cancer screening. This may be done if you have a family history of breast, ovarian, tubal, or peritoneal cancers.  Pelvic exam and Pap test. This may be done every 3 years starting at age 21. Starting at age 30, this may be done every 5 years if you have a Pap test in combination with an HPV test. Talk with your health care provider about your test results, treatment options, and if necessary, the need for more tests. Follow these instructions at home: Eating and drinking   Eat a diet that includes fresh fruits and vegetables, whole grains, lean protein, and low-fat dairy.  Take vitamin and mineral supplements as recommended by your health care provider.  Do not drink alcohol if: ? Your health care provider tells you not to drink. ? You are pregnant, may be pregnant, or are planning to become pregnant.  If you drink alcohol: ? Limit how much you have to 0-1 drink a day. ? Be aware of how much alcohol is in your drink. In the [place] one drink equals one 12 oz bottle of beer (355 mL), one 5 oz glass of wine (148 mL), or one 1 oz glass of hard liquor (44 mL). Lifestyle  Take daily care of your teeth and gums.  Stay active. Exercise for at least 30 minutes on 5 or more days each week.  Do not use any products that contain nicotine or tobacco, such as cigarettes, e-cigarettes, and chewing tobacco. If you need help quitting, ask your health care provider.  If you are  sexually active, practice safe sex. Use a condom or other form of birth control (contraception) in order to prevent pregnancy and STIs (sexually transmitted infections). If you plan to become pregnant, see your health care provider for a preconception visit. What's next?  Visit your health care provider once a year for a well check visit.  Ask your health care provider how often you should have your eyes and teeth checked.  Stay up to date on all vaccines. This information is not intended to replace advice given to you by your health care provider. Make sure you discuss any questions you have with your health care provider. Document Revised: 10/22/2017 Document Reviewed: 10/22/2017 Elsevier Patient Education  2020 Elsevier Inc.  

## 2020-02-29 NOTE — Progress Notes (Signed)
    Post Partum Visit Note  Kathleen Ayala is a 30 y.o. G26P2002 female who presents for a postpartum visit. She is 5 weeks postpartum following a normal spontaneous vaginal delivery.  I have fully reviewed the prenatal and intrapartum course. The delivery was at 39.2 gestational weeks.  Anesthesia: epidural. Postpartum course has been uncomplicated. Baby is doing well. Baby is feeding by breast. Bleeding no bleeding. Bowel function is normal. Bladder function is normal. Patient is not sexually active. Contraception method is undecided. Postpartum depression screening: negative.   The pregnancy intention screening data noted above was reviewed. Potential methods of contraception were discussed. The patient elected to proceed with Vasectomy.      The following portions of the patient's history were reviewed and updated as appropriate: allergies, current medications, past family history, past medical history, past social history, past surgical history and problem list.  Review of Systems A comprehensive review of systems was negative.    Objective:  BP 111/75   Pulse 60   Wt 147 lb (66.7 kg)   BMI 24.84 kg/m    General:  alert, cooperative, appears stated age and no distress   Breasts:  negative  Lungs: unlabored breathing  Heart:  regular rate and rhythm, S1, S2 normal, no murmur, click, rub or gallop  Abdomen:  soft, non-tender        Assessment:    Normal  postpartum exam. Pap smear not at today's visit.   Plan:   Essential components of care per ACOG recommendations:  1.  Mood and well being: Patient with negative depression screening today. Reviewed local resources for support.  - Patient does not use tobacco or illicit drugs  2. Infant care and feeding:  -Patient currently breastmilk feeding? Yes If breastmilk feeding discussed return to work and pumping. If needed, patient was provided letter for work to allow for every 2-3 hr pumping breaks, and to be granted a  private location to express breastmilk and refrigerated area to store breastmilk. Reviewed importance of draining breast regularly to support lactation. -Social determinants of health (SDOH) reviewed in EPIC. No concerns  3. Sexuality, contraception and birth spacing - Patient is uncertain about more children. Strongly considering partner vasectomy  4. Sleep and fatigue -Encouraged family/partner/community support of 4 hrs of uninterrupted sleep to help with mood and fatigue  5. Physical Recovery  - Discussed patients delivery and complications - Patient had a postpartum hemorrhage - Patient has urinary incontinence? Yes - Patient is safe to resume physical and sexual activity  6.  Health Maintenance - Last pap smear done 01/2019 and was normal with negative HPV.  PPH, s/p inpatient Fe. SOB, extreme fatigue resolved about two weeks ago. Will check CBC to assess current status.  Clayton Bibles, MSN, CNM Certified Nurse Midwife, [facility] for [employer], [facility] 02/29/20 12:49 PM

## 2020-03-07 ENCOUNTER — Other Ambulatory Visit: Payer: Self-pay | Admitting: *Deleted

## 2020-03-07 MED ORDER — FLUCONAZOLE 150 MG PO TABS: 150.0000 mg | ORAL_TABLET | Freq: Once | ORAL | 3 refills | Status: AC

## 2021-04-16 ENCOUNTER — Ambulatory Visit: Payer: Self-pay | Admitting: Internal Medicine

## 2021-04-16 NOTE — Progress Notes (Unsigned)
HPI  Patient presents to clinic today to establish care.  Kathleen Ayala has concerns about ADD.  Past Medical History:  Diagnosis Date   Vaginal Pap smear, abnormal    cryotherapy    No current outpatient medications on file.   No current facility-administered medications for this visit.    No Known Allergies  Family History  Problem Relation Age of Onset   Cancer Other        LUNG   Healthy Mother    Healthy Father    Diabetes Maternal Grandmother    Drug abuse Maternal Grandfather    Hypertension Neg Hx    Stroke Neg Hx     Social History   Socioeconomic History   Marital status: Married    Spouse name: Marylyn Ishihara   Number of children: 1   Years of education: Not on file   Highest education level: Some college, no degree  Occupational History   Not on file  Tobacco Use   Smoking status: Never   Smokeless tobacco: Never  Vaping Use   Vaping Use: Never used  Substance and Sexual Activity   Alcohol use: Not Currently    Comment: social   Drug use: No   Sexual activity: Yes    Partners: Male    Birth control/protection: None  Other Topics Concern   Not on file  Social History Narrative   Not on file   Social Determinants of Health   Financial Resource Strain: Not on file  Food Insecurity: Not on file  Transportation Needs: Not on file  Physical Activity: Not on file  Stress: Not on file  Social Connections: Not on file  Intimate Partner Violence: Not on file    ROS:  Constitutional: Denies fever, malaise, fatigue, headache or abrupt weight changes.  HEENT: Denies eye pain, eye redness, ear pain, ringing in the ears, wax buildup, runny nose, nasal congestion, bloody nose, or sore throat. Respiratory: Denies difficulty breathing, shortness of breath, cough or sputum production.   Cardiovascular: Denies chest pain, chest tightness, palpitations or swelling in the hands or feet.  Gastrointestinal: Denies abdominal pain, bloating, constipation, diarrhea or blood in  the stool.  GU: Denies frequency, urgency, pain with urination, blood in urine, odor or discharge. Musculoskeletal: Denies decrease in range of motion, difficulty with gait, muscle pain or joint pain and swelling.  Skin: Denies redness, rashes, lesions or ulcercations.  Neurological: Denies dizziness, difficulty with memory, difficulty with speech or problems with balance and coordination.  Psych: Denies anxiety, depression, SI/HI.  No other specific complaints in a complete review of systems (except as listed in HPI above).  PE:  There were no vitals taken for this visit. Wt Readings from Last 3 Encounters:  02/29/20 147 lb (66.7 kg)  01/30/20 162 lb 9.6 oz (73.8 kg)  01/27/20 176 lb 3.2 oz (79.9 kg)    General: Appears their stated age, well developed, well nourished in NAD. HEENT: Head: normal shape and size; Eyes: sclera white, no icterus, conjunctiva pink, PERRLA and EOMs intact; Ears: Tm's gray and intact, normal light reflex;Throat/Mouth: Teeth present, mucosa pink and moist, no lesions or ulcerations noted.  Neck: Neck supple, trachea midline. No masses, lumps or thyromegaly present.  Cardiovascular: Normal rate and rhythm. S1,S2 noted.  No murmur, rubs or gallops noted. No JVD or BLE edema. No carotid bruits noted. Pulmonary/Chest: Normal effort and positive vesicular breath sounds. No respiratory distress. No wheezes, rales or ronchi noted.  Abdomen: Soft and nontender. Normal bowel sounds, no  bruits noted. No distention or masses noted. Liver, spleen and kidneys non palpable. Musculoskeletal: Normal range of motion. Strength 5/5 BUE/BLE. No signs of joint swelling. No difficulty with gait.  Neurological: Alert and oriented. Cranial nerves II-XII grossly intact. Coordination normal.  Psychiatric: Mood and affect normal. Behavior is normal. Judgment and thought content normal.   EKG:  BMET    Component Value Date/Time   NA 134 (L) 04/25/2017 1435   K 3.8 04/25/2017 1435    CL 103 04/25/2017 1435   CO2 23 04/25/2017 1435   GLUCOSE 94 04/25/2017 1435   BUN 9 04/25/2017 1435   CREATININE 0.72 04/25/2017 1435   CALCIUM 8.7 (L) 04/25/2017 1435   GFRNONAA >60 04/25/2017 1435   GFRAA >60 04/25/2017 1435    Lipid Panel  No results found for: CHOL, TRIG, HDL, CHOLHDL, VLDL, LDLCALC  CBC    Component Value Date/Time   WBC 5.7 02/29/2020 0941   WBC 15.5 (H) 01/28/2020 0243   RBC 5.11 02/29/2020 0941   RBC 3.01 (L) 01/28/2020 0243   HGB 12.8 02/29/2020 0941   HCT 42.8 02/29/2020 0941   PLT 315 02/29/2020 0941   MCV 84 02/29/2020 0941   MCH 25.0 (L) 02/29/2020 0941   MCH 25.9 (L) 01/28/2020 0243   MCHC 29.9 (L) 02/29/2020 0941   MCHC 31.5 01/28/2020 0243   RDW 16.0 (H) 02/29/2020 0941   LYMPHSABS 1.5 07/06/2019 0839   MONOABS 0.6 04/25/2017 1435   EOSABS 0.1 07/06/2019 0839   BASOSABS 0.0 07/06/2019 0839    Hgb A1C No results found for: HGBA1C   Assessment and Plan:   Webb Silversmith, NP This visit occurred during the SARS-CoV-2 public health emergency.  Safety protocols were in place, including screening questions prior to the visit, additional usage of staff PPE, and extensive cleaning of exam room while observing appropriate contact time as indicated for disinfecting solutions.

## 2022-01-19 IMAGING — US US MFM OB FOLLOW-UP
1 series · 14 of 28 positions shown · non-contrast
Comparison: none

[Series 1: us mfm ob follow-up · 53 acquisitions, 14 frames shown]
[im 2/53]
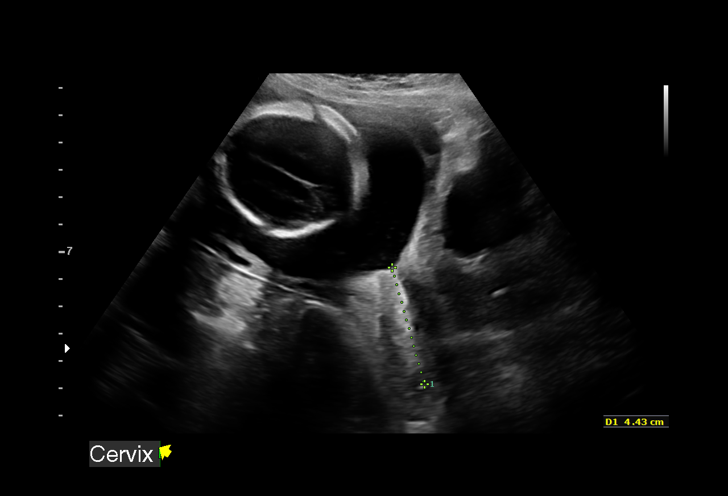
[im 6/53]
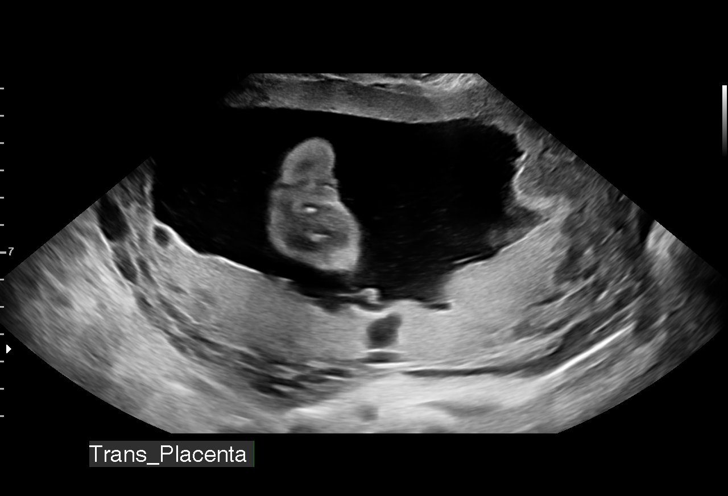
[im 10/53]
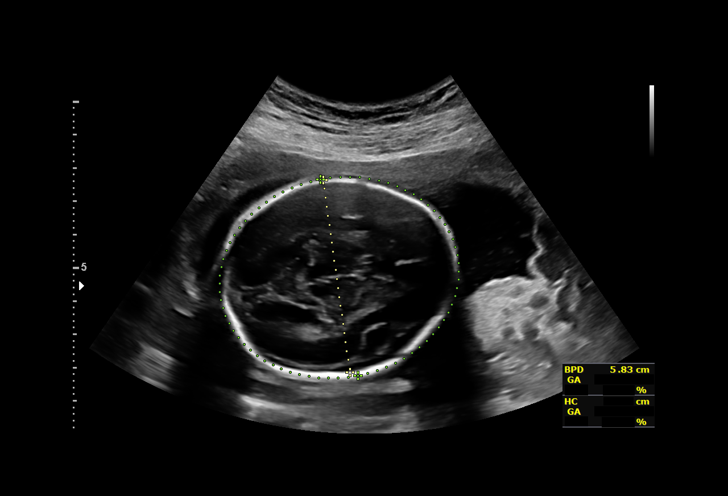
[im 14/53]
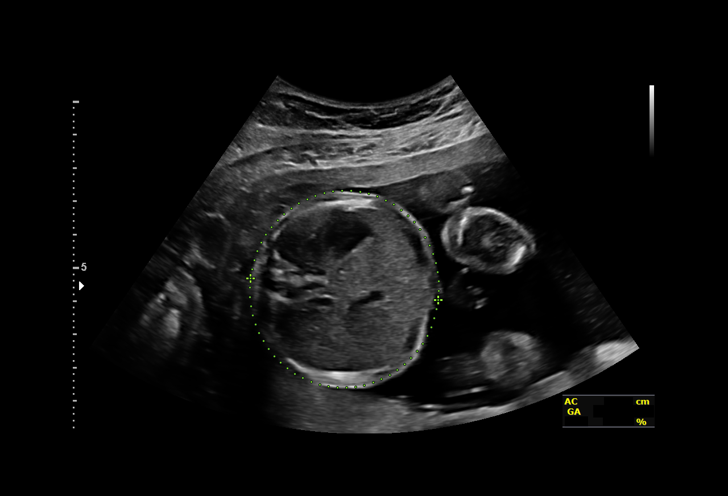
[im 18/53]
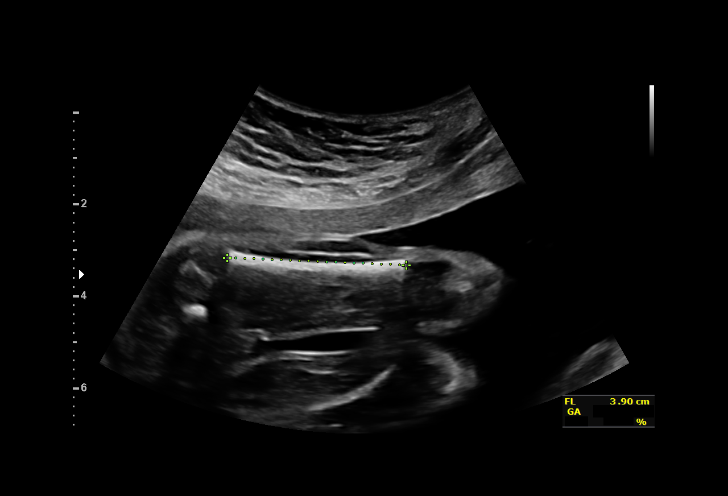
[im 22/53]
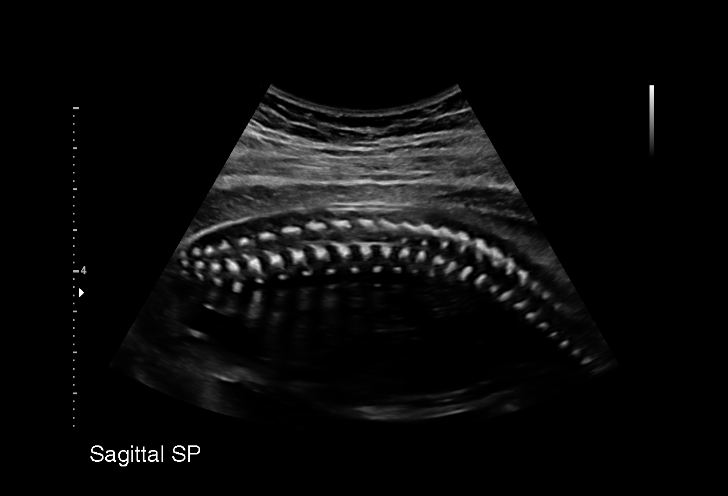
[im 26/53]
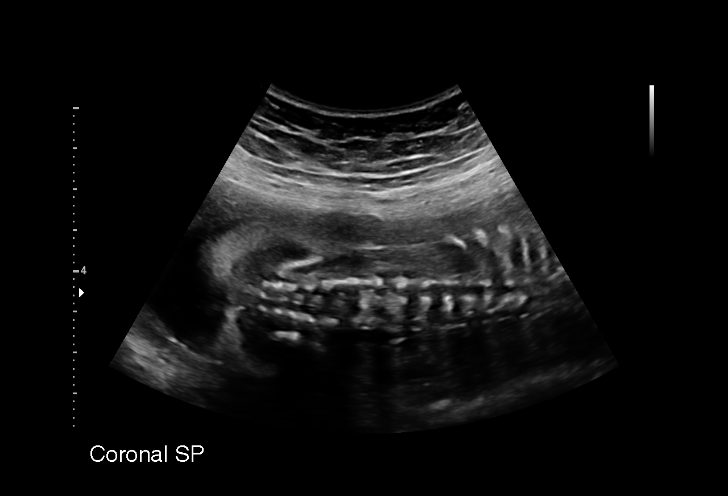
[im 29/53]
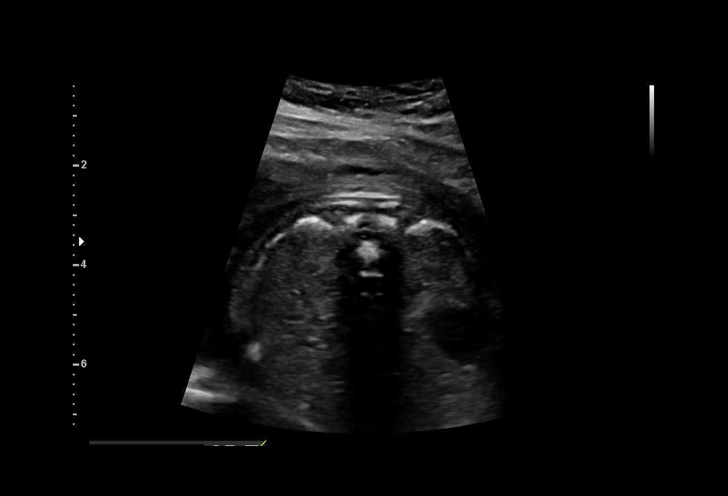
[im 33/53]
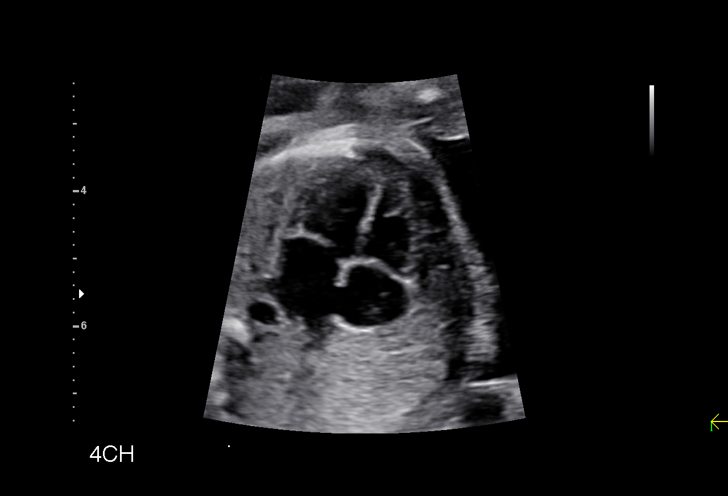
[im 37/53]
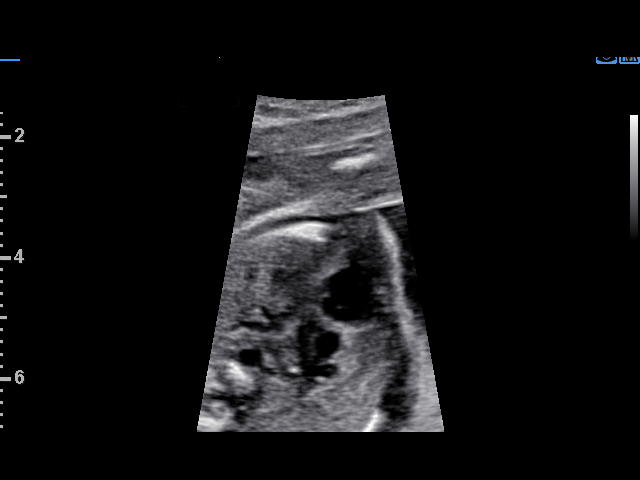
[im 41/53]
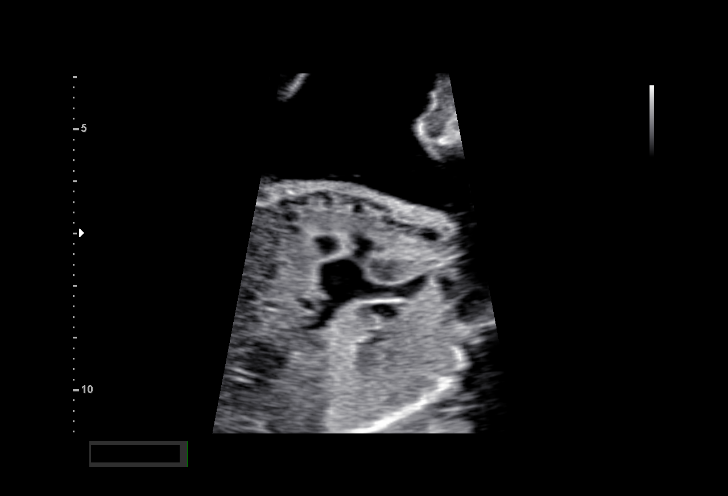
[im 45/53]
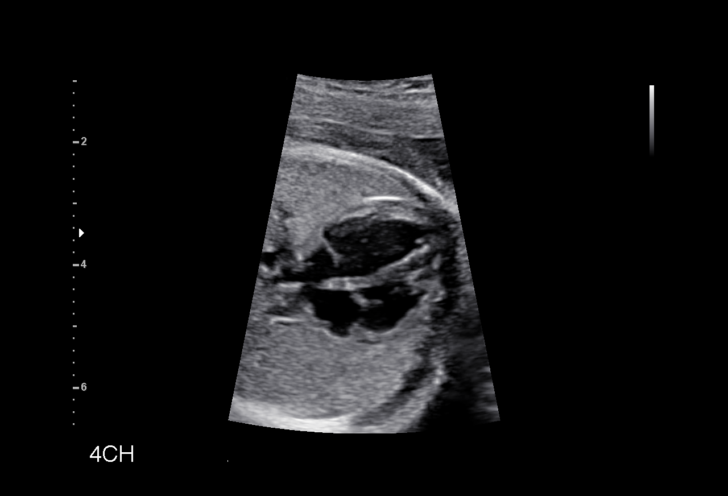
[im 49/53]
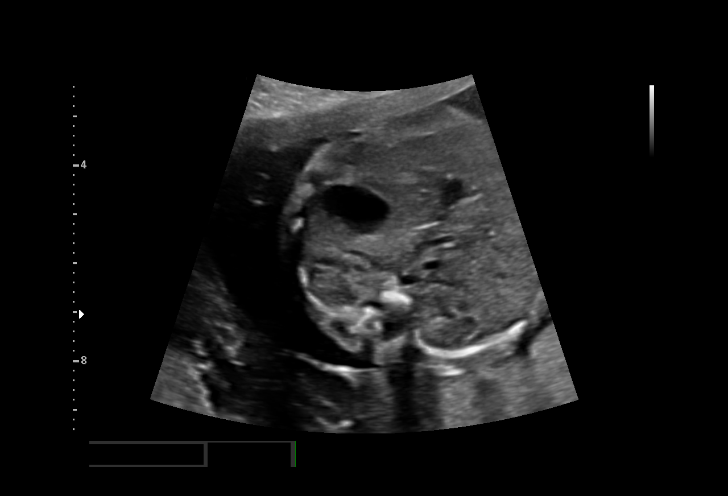
[im 53/53]
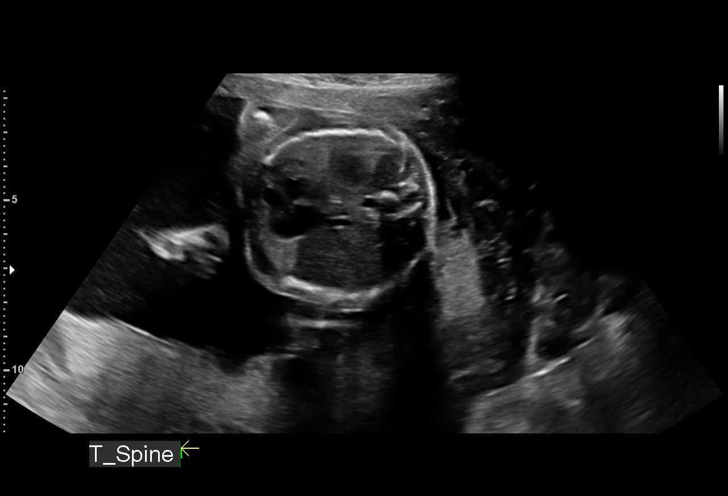

[14 of 28 positions shown; findings below may reference images not displayed]

Indications

 Encounter for other antenatal screening
 follow-up
 Low risk NIPS,
 23 weeks gestation of pregnancy
Fetal Evaluation

 Num Of Fetuses:         1
 Cardiac Activity:       Observed
 Presentation:           Cephalic
 Placenta:               Posterior Fundal
 P. Cord Insertion:      Visualized

 Amniotic Fluid
 AFI FV:      Upper limit of normal

                             Largest Pocket(cm)
                             8.
Biometry

 BPD:      58.5  mm     G. Age:  24w 0d         74  %    CI:        78.26   %    70 - 86
                                                         FL/HC:      18.8   %    19.2 -
 HC:      209.2  mm     G. Age:  23w 0d         29  %    HC/AC:      1.14        1.05 -
 AC:      183.1  mm     G. Age:  23w 1d         41  %    FL/BPD:     67.4   %    71 - 87
 FL:       39.4  mm     G. Age:  22w 5d         24  %    FL/AC:      21.5   %    20 - 24
 LV:        4.5  mm

 Est. FW:     552  gm      1 lb 3 oz     35  %
OB History

 Gravidity:    2         Term:   1        Prem:   0        SAB:   0
 TOP:          0       Ectopic:  0        Living: 1
Gestational Age

 LMP:           23w 1d        Date:  04/27/19                 EDD:   02/01/20
 U/S Today:     23w 2d                                        EDD:   01/31/20
 Best:          23w 1d     Det. By:  LMP  (04/27/19)          EDD:   02/01/20
Anatomy

 Cranium:               Appears normal         LVOT:                   Appears normal
 Cavum:                 Appears normal         Aortic Arch:            Appears normal
 Ventricles:            Appears normal         Ductal Arch:            Appears normal
 Choroid Plexus:        Appears normal         Diaphragm:              Appears normal
 Cerebellum:            Appears normal         Stomach:                Appears normal, left
                                                                       sided
 Posterior Fossa:       Appears normal         Abdominal Wall:         Appears nml (cord
                                                                       insert, abd wall)
 Nuchal Fold:           Not applicable (>20    Cord Vessels:           Appears normal (3
                        wks GA)                                        vessel cord)
 Face:                  Appears normal         Kidneys:                Appear normal
                        (orbits and profile)
 Lips:                  Appears normal         Bladder:                Appears normal
 Palate:                Appears normal         Spine:                  Appears normal
 Thoracic:              Appears normal         Upper Extremities:      Previously seen
 Heart:                 Appears normal         Lower Extremities:      Previously seen
                        (4CH, axis, and
                        situs)
 RVOT:                  Appears normal
Cervix Uterus Adnexa

 Cervix
 Length:           4.43  cm.
 Normal appearance by transabdominal scan.
Impression

 Patient return for completion of fetal anatomy.Amniotic fluid is
 normal and good fetal activity is seen .Fetal growth is
 appropriate for gestational age .  Fetal spine appears normal.
 Patient opts not to screen for open neural tube defects.

 I have reassured the patient of the findings.
Recommendations

 Follow-up scans as clinically indicated.
                 Anastacio, [place]
